# Patient Record
Sex: Female | Born: 2000
Health system: Southern US, Community
[De-identification: ages and names within clinical notes are randomized; demographics above are authoritative.]

## PROBLEM LIST (undated history)

## (undated) DIAGNOSIS — A6000 Herpesviral infection of urogenital system, unspecified: Secondary | ICD-10-CM

## (undated) DIAGNOSIS — J302 Other seasonal allergic rhinitis: Secondary | ICD-10-CM

## (undated) HISTORY — DX: Herpesviral infection of urogenital system, unspecified: A60.00

## (undated) HISTORY — PX: WISDOM TOOTH EXTRACTION: SHX21

---
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
## 2022-01-29 ENCOUNTER — Ambulatory Visit: Payer: Self-pay

## 2022-03-27 ENCOUNTER — Other Ambulatory Visit: Payer: Self-pay

## 2022-03-27 ENCOUNTER — Emergency Department
Admission: EM | Admit: 2022-03-27 | Discharge: 2022-03-27 | Disposition: A | Discharge status: Home / Self Care | Payer: Medicaid Other | Attending: Student in an Organized Health Care Education/Training Program | Admitting: Student in an Organized Health Care Education/Training Program

## 2022-03-27 DIAGNOSIS — O26891 Other specified pregnancy related conditions, first trimester: Secondary | ICD-10-CM | POA: Diagnosis present

## 2022-03-27 DIAGNOSIS — R0602 Shortness of breath: Secondary | ICD-10-CM | POA: Insufficient documentation

## 2022-03-27 DIAGNOSIS — Z3A14 14 weeks gestation of pregnancy: Secondary | ICD-10-CM | POA: Diagnosis not present

## 2022-03-27 DIAGNOSIS — R059 Cough, unspecified: Secondary | ICD-10-CM | POA: Insufficient documentation

## 2022-03-27 DIAGNOSIS — R0781 Pleurodynia: Secondary | ICD-10-CM | POA: Insufficient documentation

## 2022-03-27 DIAGNOSIS — R0789 Other chest pain: Secondary | ICD-10-CM

## 2022-03-27 LAB — BASIC METABOLIC PANEL
Anion gap: 5 (ref 5–15)
BUN: 7 mg/dL (ref 6–20)
CO2: 23 mmol/L (ref 22–32)
Calcium: 9.2 mg/dL (ref 8.9–10.3)
Chloride: 109 mmol/L (ref 98–111)
Creatinine, Ser: 0.62 mg/dL (ref 0.44–1.00)
GFR, Estimated: >60 mL/min (ref >60)
Glucose, Bld: 89 mg/dL (ref 70–99)
Potassium: 3.8 mmol/L (ref 3.5–5.1)
Sodium: 137 mmol/L (ref 135–145)

## 2022-03-27 LAB — CBC WITH DIFFERENTIAL/PLATELET
Abs Immature Granulocytes: 0.03 10*3/uL (ref 0.00–0.07)
Basophils Absolute: 0 10*3/uL (ref 0.0–0.1)
Basophils Relative: 0 %
Eosinophils Absolute: 0.1 10*3/uL (ref 0.0–0.5)
Eosinophils Relative: 1 %
HCT: 36.2 % (ref 36.0–46.0)
Hemoglobin: 11.9 g/dL — ABNORMAL LOW (ref 12.0–15.0)
Immature Granulocytes: 0 %
Lymphocytes Relative: 14 %
Lymphs Abs: 1.5 10*3/uL (ref 0.7–4.0)
MCH: 26.9 pg (ref 26.0–34.0)
MCHC: 32.9 g/dL (ref 30.0–36.0)
MCV: 81.9 fL (ref 80.0–100.0)
Monocytes Absolute: 0.6 10*3/uL (ref 0.1–1.0)
Monocytes Relative: 6 %
Neutro Abs: 8.5 10*3/uL — ABNORMAL HIGH (ref 1.7–7.7)
Neutrophils Relative %: 79 %
Platelets: 314 10*3/uL (ref 150–400)
RBC: 4.42 MIL/uL (ref 3.87–5.11)
RDW: 13.9 % (ref 11.5–15.5)
WBC: 10.8 10*3/uL — ABNORMAL HIGH (ref 4.0–10.5)
nRBC: 0 % (ref 0.0–0.2)

## 2022-03-27 LAB — POC URINE PREG, ED: Preg Test, Ur: POSITIVE — AB

## 2022-03-27 LAB — TROPONIN I (HIGH SENSITIVITY): Troponin I (High Sensitivity): <2 ng/L (ref <18)

## 2022-03-27 MED ORDER — LIDOCAINE 5 % EX PTCH
1.0000 | MEDICATED_PATCH | CUTANEOUS | Status: DC
  Administered 2022-03-27: 1 via TRANSDERMAL
  Filled 2022-03-27: qty 1

## 2022-03-27 NOTE — ED Notes (Signed)
Urine sent to lab at this time.  POC preg done: POS

## 2022-03-27 NOTE — ED Triage Notes (Addendum)
Pt presents to ED with c/o of L sided rib pain and some SOB. Pt denies injury or trauma. Pt denies fever or chills. Both started last night. Pt states cough but not productive at this time. Pt speaking in full sentences with no distress.   Pt is about [redacted] weeks pregnant.

## 2022-03-27 NOTE — ED Provider Notes (Signed)
[facility] Provider Note    Event Date/Time   First MD Initiated Contact with Patient 03/27/22 1136     (approximate)   History   Rib Injury   HPI  Tasha Oneal is a 21 y.o. female currently estimated to be [redacted] weeks pregnant who presents today for evaluation of left-sided rib pain.  Patient also reports that she has had a dry cough that began yesterday.  She reports that her pain is worse with taking deep breaths.  She has not had any lower extremity swelling or calf pain.  There is no personal or family history of PE or DVT.  She denies any injury to her rib.  No fevers or chills.  She reports that she feels slightly short of breath.  Patient Active Problem List   Diagnosis Date Noted   Adjustment disorder with mixed disturbance of emotions and conduct 11/17/2020   Major depressive disorder, recurrent episode, mild with anxious distress (HCC) 11/17/2020          Physical Exam   Triage Vital Signs: ED Triage Vitals  Enc Vitals Group     BP 03/27/22 1120 102/79     Pulse Rate 03/27/22 1120 85     Resp 03/27/22 1120 18     Temp 03/27/22 1120 98.1 F (36.7 C)     Temp Source 03/27/22 1120 Oral     SpO2 03/27/22 1120 98 %     Weight 03/27/22 1133 127 lb 3.3 oz (57.7 kg)     Height 03/27/22 1133 5\' 4"  (1.626 m)     Head Circumference --      Peak Flow --      Pain Score 03/27/22 1120 7     Pain Loc --      Pain Edu? --      Excl. in GC? --     Most recent vital signs: Vitals:   03/27/22 1120  BP: 102/79  Pulse: 85  Resp: 18  Temp: 98.1 F (36.7 C)  SpO2: 98%    Physical Exam Vitals and nursing note reviewed.  Constitutional:      General: Awake and alert. No acute distress.    Appearance: Normal appearance. The patient is normal weight.  HENT:     Head: Normocephalic and atraumatic.     Mouth: Mucous membranes are moist.  Eyes:     General: PERRL. Normal EOMs        Right eye: No discharge.        Left eye: No discharge.      Conjunctiva/sclera: Conjunctivae normal.  Cardiovascular:     Rate and Rhythm: Normal rate and regular rhythm.     Pulses: Normal pulses.  Pulmonary:     Effort: Pulmonary effort is normal. No respiratory distress.     Breath sounds: Normal breath sounds.  Mild chest wall tenderness to left lateral chest. No ecchymosis Abdominal:     Abdomen is soft. There is no abdominal tenderness. No rebound or guarding. No distention. Musculoskeletal:        General: No swelling. Normal range of motion.     Cervical back: Normal range of motion and neck supple.  Skin:    General: Skin is warm and dry.     Capillary Refill: Capillary refill takes less than 2 seconds.     Findings: No rash.  Neurological:     Mental Status: The patient is awake and alert.      ED Results /  Procedures / Treatments   Labs (all labs ordered are listed, but only abnormal results are displayed) Labs Reviewed  CBC WITH DIFFERENTIAL/PLATELET - Abnormal; Notable for the following components:      Result Value   WBC 10.8 (*)    Hemoglobin 11.9 (*)    Neutro Abs 8.5 (*)    All other components within normal limits  POC URINE PREG, ED - Abnormal; Notable for the following components:   Preg Test, Ur POSITIVE (*)    All other components within normal limits  BASIC METABOLIC PANEL  TROPONIN I (HIGH SENSITIVITY)  TROPONIN I (HIGH SENSITIVITY)     EKG     RADIOLOGY     PROCEDURES:  Critical Care performed:   Procedures   MEDICATIONS ORDERED IN ED: Medications  lidocaine (LIDODERM) 5 % 1 patch (1 patch Transdermal Patch Applied 03/27/22 1226)     IMPRESSION / MDM / ASSESSMENT AND PLAN / ED COURSE  I reviewed the triage vital signs and the nursing notes.   Differential diagnosis includes, but is not limited to, pleurisy, costochondritis, URI.  No tachycardia or hypoxia or clinical signs or symptoms of DVT to suggest PE as a source of her symptoms today.  EKG demonstrates normal sinus rhythm,  no ischemic signs or changes, normal intervals.  Patient did wish to proceed with blood work, which was normal. Negative troponin.  No heart strain. Discussed the risks and benefits of obtaining imaging given that she is pregnant, she wishes to forego x-ray at this time which I feel is appropriate.  Lungs are clear to auscultation bilaterally, breath sounds equal bilaterally, doubt spontaneous pneumothorax.  She does report a cough, it is possible that she has costochondritis.  She was treated symptomatically with Lidoderm patch with improvement of her symptoms.  We discussed symptomatic management at home, as well as return precautions.  Patient or stands and agrees with plan.  She was discharged in stable condition.  She requested a work note which was provided.   Patient's presentation is most consistent with acute complicated illness / injury requiring diagnostic workup.   FINAL CLINICAL IMPRESSION(S) / ED DIAGNOSES   Final diagnoses:  Chest wall pain     Rx / DC Orders   ED Discharge Orders     None        Note:  This document was prepared using Dragon voice recognition software and may include unintentional dictation errors.   Jackelyn Hoehn, PA-C 03/27/22 1358    Willy Eddy, MD 03/27/22 1408

## 2022-03-27 NOTE — Discharge Instructions (Addendum)
You may take tylenol 650mg  every 6-8 hours as needed for pain. Please follow up with your outpatient provider. It was a pleasure caring for you.

## 2022-04-20 ENCOUNTER — Ambulatory Visit (INDEPENDENT_AMBULATORY_CARE_PROVIDER_SITE_OTHER): Payer: Medicaid Other

## 2022-04-20 ENCOUNTER — Other Ambulatory Visit: Payer: Self-pay

## 2022-04-20 ENCOUNTER — Other Ambulatory Visit: Payer: Medicaid Other

## 2022-04-20 DIAGNOSIS — Z3402 Encounter for supervision of normal first pregnancy, second trimester: Secondary | ICD-10-CM

## 2022-04-20 DIAGNOSIS — Z3480 Encounter for supervision of other normal pregnancy, unspecified trimester: Secondary | ICD-10-CM | POA: Insufficient documentation

## 2022-04-20 DIAGNOSIS — Z369 Encounter for antenatal screening, unspecified: Secondary | ICD-10-CM

## 2022-04-20 DIAGNOSIS — Z3689 Encounter for other specified antenatal screening: Secondary | ICD-10-CM

## 2022-04-20 DIAGNOSIS — Z3490 Encounter for supervision of normal pregnancy, unspecified, unspecified trimester: Secondary | ICD-10-CM | POA: Insufficient documentation
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
## 2022-04-21 ENCOUNTER — Other Ambulatory Visit: Payer: Self-pay | Admitting: Obstetrics & Gynecology

## 2022-04-21 ENCOUNTER — Telehealth: Payer: Self-pay

## 2022-04-21 LAB — CBC/D/PLT+RPR+RH+ABO+RUBIGG...
Antibody Screen: NEGATIVE
Basophils Absolute: 0 10*3/uL (ref 0.0–0.2)
Basos: 0 %
EOS (ABSOLUTE): 0 10*3/uL (ref 0.0–0.4)
Eos: 0 %
HCV Ab: NONREACTIVE
HIV Screen 4th Generation wRfx: NONREACTIVE
Hematocrit: 36 % (ref 34.0–46.6)
Hemoglobin: 12.1 g/dL (ref 11.1–15.9)
Hepatitis B Surface Ag: NEGATIVE
Immature Grans (Abs): 0 10*3/uL (ref 0.0–0.1)
Immature Granulocytes: 0 %
Lymphocytes Absolute: 1.2 10*3/uL (ref 0.7–3.1)
Lymphs: 15 %
MCH: 27.7 pg (ref 26.6–33.0)
MCHC: 33.6 g/dL (ref 31.5–35.7)
MCV: 82 fL (ref 79–97)
Monocytes Absolute: 0.3 10*3/uL (ref 0.1–0.9)
Monocytes: 4 %
Neutrophils Absolute: 6.5 10*3/uL (ref 1.4–7.0)
Neutrophils: 81 %
Platelets: 299 10*3/uL (ref 150–450)
RBC: 4.37 x10E6/uL (ref 3.77–5.28)
RDW: 12.7 % (ref 11.7–15.4)
RPR Ser Ql: NONREACTIVE
Rh Factor: POSITIVE
Rubella Antibodies, IGG: 4.07 index (ref >0.99)
Varicella zoster IgG: 580 index (ref >165)
WBC: 8.1 10*3/uL (ref 3.4–10.8)

## 2022-04-21 LAB — HCV INTERPRETATION

## 2022-04-21 MED ORDER — VALACYCLOVIR HCL 500 MG PO TABS: 500.0000 mg | ORAL_TABLET | Freq: Every day | ORAL | 5 refills | Status: DC

## 2022-04-21 NOTE — Progress Notes (Signed)
Refill for valtrex given after patient request.

## 2022-04-21 NOTE — Telephone Encounter (Signed)
Pt indicated she needs a refill of valtrex; just got over an outbreak and would like to have more on hand in case she has another one.  Pt is preg.

## 2022-04-23 ENCOUNTER — Ambulatory Visit: Payer: Medicaid Other | Admitting: Physician Assistant

## 2022-04-23 ENCOUNTER — Other Ambulatory Visit: Payer: Self-pay

## 2022-04-23 DIAGNOSIS — Z3689 Encounter for other specified antenatal screening: Secondary | ICD-10-CM

## 2022-04-23 DIAGNOSIS — Z3402 Encounter for supervision of normal first pregnancy, second trimester: Secondary | ICD-10-CM

## 2022-04-23 DIAGNOSIS — Z719 Counseling, unspecified: Secondary | ICD-10-CM

## 2022-04-23 NOTE — Progress Notes (Signed)
Patient was here this afternoon for new OB visit. Patient started her OB care on 04/20/22 at [facility] and is hoping for an U/S. I explained to patient that we cannot do U/S here at [facility] and would have to order it at the hospital just like [facility] would. TC to [facility] and after looking at patient chart with Jean Rosenthal will talk with provider about ordering an U/S and then call the patient. Patient has another appointment at [facility] on 05/05/22. I went back to patient to explain what Huntley Dec said, and asked the patient if she was wanting to start her maternity care here or to continue with [facility]. Patient states she wants to continue with [facility]. She is very excited about her pregnancy and wants to know the sex of her baby. Patient had a materniT21 test at [facility] and patient taken to clerical to help her with signing up for My Chart, so she can view the results. Patient aware to expect call from [facility] regarding U/S day/time/location. Patient counseled to keep her 05/05/22 appointment at [facility], and to call us if she decides she wants to change her prenatal care and come to [facility]. Patient states understanding and states she is grateful for the help.Burt Knack, RN

## 2022-04-25 LAB — MATERNIT 21 PLUS CORE, BLOOD
Fetal Fraction: 7
Result (T21): NEGATIVE
Trisomy 13 (Patau syndrome): NEGATIVE
Trisomy 18 (Edwards syndrome): NEGATIVE
Trisomy 21 (Down syndrome): NEGATIVE

## 2022-04-28 ENCOUNTER — Ambulatory Visit: Payer: Medicaid Other

## 2022-04-30 ENCOUNTER — Ambulatory Visit
Admission: RE | Admit: 2022-04-30 | Discharge: 2022-04-30 | Disposition: A | Discharge status: Home / Self Care | Payer: Medicaid Other | Source: Ambulatory Visit | Attending: Obstetrics & Gynecology | Admitting: Obstetrics & Gynecology

## 2022-04-30 DIAGNOSIS — Z3689 Encounter for other specified antenatal screening: Secondary | ICD-10-CM | POA: Insufficient documentation

## 2022-04-30 DIAGNOSIS — Z3402 Encounter for supervision of normal first pregnancy, second trimester: Secondary | ICD-10-CM | POA: Insufficient documentation

## 2022-04-30 DIAGNOSIS — Z3687 Encounter for antenatal screening for uncertain dates: Secondary | ICD-10-CM | POA: Diagnosis present

## 2022-04-30 DIAGNOSIS — Z3A14 14 weeks gestation of pregnancy: Secondary | ICD-10-CM | POA: Insufficient documentation

## 2022-05-04 ENCOUNTER — Encounter: Payer: Self-pay | Admitting: Obstetrics & Gynecology

## 2022-05-04 DIAGNOSIS — O4402 Placenta previa specified as without hemorrhage, second trimester: Secondary | ICD-10-CM | POA: Insufficient documentation

## 2022-05-05 ENCOUNTER — Other Ambulatory Visit (HOSPITAL_COMMUNITY)
Admission: RE | Admit: 2022-05-05 | Discharge: 2022-05-05 | Disposition: A | Discharge status: Home / Self Care | Payer: BC Managed Care – PPO | Source: Ambulatory Visit | Attending: Obstetrics & Gynecology | Admitting: Obstetrics & Gynecology

## 2022-05-05 ENCOUNTER — Ambulatory Visit (INDEPENDENT_AMBULATORY_CARE_PROVIDER_SITE_OTHER): Payer: Medicaid Other | Admitting: Obstetrics & Gynecology

## 2022-05-05 VITALS — BP 112/71 | HR 80 | Wt 133.0 lb

## 2022-05-05 DIAGNOSIS — Z3A15 15 weeks gestation of pregnancy: Secondary | ICD-10-CM

## 2022-05-05 DIAGNOSIS — Z3402 Encounter for supervision of normal first pregnancy, second trimester: Secondary | ICD-10-CM

## 2022-05-05 DIAGNOSIS — N898 Other specified noninflammatory disorders of vagina: Secondary | ICD-10-CM | POA: Insufficient documentation

## 2022-05-05 MED ORDER — VALACYCLOVIR HCL 500 MG PO TABS: 500.0000 mg | ORAL_TABLET | Freq: Every day | ORAL | 5 refills | Status: DC

## 2022-05-05 NOTE — Progress Notes (Signed)
  Subjective:    Tasha Oneal is a single G1P0 at [redacted] weeks EGA being seen today for her first obstetrical visit.  This is not a planned pregnancy. She is at [redacted]w[redacted]d gestation. Her obstetrical history is significant for  hsv . Relationship with FOB:  not together at the present time . Patient does intend to breast feed. Pregnancy history fully reviewed. She had an ultrasound last week that dates her at 14.4 weeks at the time  Patient reports no complaints.  Review of Systems:   Review of Systems  Objective:     BP 112/71   Pulse 80   Wt 133 lb (60.3 kg)   LMP 12/18/2021 (Exact Date)   BMI 22.83 kg/m  Physical Exam  Well nourished, well hydrated  female, no apparent distress She is ambulating and conversing normally. General:  alert   Breasts:  inspection negative, no nipple discharge or bleeding, no masses or nodularity palpable  Lungs: clear to auscultation bilaterally  Heart:  regular rate and rhythm, S1, S2 normal, no click, rub or gallop. There is a 2/6 SEM  Abdomen: soft, non-tender; bowel sounds normal; no masses,  no organomegaly FHR 145   Vulva:  normal  Vagina: normal  Cervix:  No lesions, normal discharge  Corpus: 15 week size  Adnexa:  not enlarged or painful  Rectal Exam: Not performed.         Assessment:    Pregnancy: G1P0 Patient Active Problem List   Diagnosis Date Noted   Placenta previa antepartum in second trimester 05/04/2022   Supervision of other normal pregnancy, antepartum 04/20/2022   Adjustment disorder with mixed disturbance of emotions and conduct 11/17/2020   Major depressive disorder, recurrent episode, mild with anxious distress (HCC) 11/17/2020       Plan:     Initial labs drawn. Prenatal vitamins. Problem list reviewed and updated. Role of ultrasound in pregnancy discussed; fetal survey: ordered. Amniocentesis discussed: not indicated. Follow up in 4 weeks. + HSV- on suppressive treatment. Refills given as requested  Allie Bossier 05/05/2022

## 2022-05-05 NOTE — Addendum Note (Signed)
Addended by: Loney Laurence on: 05/05/2022 08:49 AM   Modules accepted: Orders

## 2022-05-06 LAB — CERVICOVAGINAL ANCILLARY ONLY
Bacterial Vaginitis (gardnerella): NEGATIVE
Candida Glabrata: NEGATIVE
Candida Vaginitis: POSITIVE — AB
Chlamydia: NEGATIVE
Comment: NEGATIVE
Comment: NEGATIVE
Comment: NEGATIVE
Comment: NEGATIVE
Comment: NEGATIVE
Comment: NORMAL
Neisseria Gonorrhea: NEGATIVE
Trichomonas: NEGATIVE

## 2022-05-06 LAB — URINALYSIS, ROUTINE W REFLEX MICROSCOPIC
Bilirubin, UA: NEGATIVE
Glucose, UA: NEGATIVE
Ketones, UA: NEGATIVE
Nitrite, UA: NEGATIVE
RBC, UA: NEGATIVE
Specific Gravity, UA: >=1.03 — AB (ref 1.005–1.030)
Urobilinogen, Ur: 0.2 mg/dL (ref 0.2–1.0)
pH, UA: 6 (ref 5.0–7.5)

## 2022-05-06 LAB — MICROSCOPIC EXAMINATION
Casts: NONE SEEN /lpf
Epithelial Cells (non renal): >10 /hpf — AB (ref 0–10)
RBC, Urine: NONE SEEN /hpf (ref 0–2)

## 2022-05-07 LAB — CULTURE, OB URINE

## 2022-05-07 LAB — URINE CULTURE, OB REFLEX

## 2022-05-08 LAB — MONITOR DRUG PROFILE 14(MW)

## 2022-05-08 LAB — NICOTINE SCREEN, URINE

## 2022-05-18 NOTE — L&D Delivery Note (Signed)
Delivery Note At  1314, a viable female was delivered vaginally over an intact perineum with one small left labial laceration. The head presented direct OA, with restitution to ROT. The anterioir shoulder delivered with gentle downward guidance, followed by upward guidance to deliver the posterior shoulder. Spontaneous respirations were noted from the baby. APGAR:8 9, ; weight  .pending   Placenta status: delivered intact at 1324 ,  .  Cord: 3 vessel  with the following complications:  none.    Anesthesia:  epidural Episiotomy:  none Lacerations:  right labial Suture Repair: 3.0 vicryl rapide Est. Blood Loss (mL):  200  Mom to postpartum.  Baby to Couplet care / Skin to Skin.  Mirna Mires 10/28/2022, 1:48 PM

## 2022-06-01 ENCOUNTER — Other Ambulatory Visit: Payer: Medicaid Other

## 2022-06-01 ENCOUNTER — Telehealth: Payer: Self-pay | Admitting: Advanced Practice Midwife

## 2022-06-01 NOTE — Telephone Encounter (Signed)
I contacted patient via phone. I left message for patient to call back about rescheduling her anatomy ultrasound due to no ultrasound tech in office. I left number to centralized scheduling [phone] option

## 2022-06-02 ENCOUNTER — Ambulatory Visit (INDEPENDENT_AMBULATORY_CARE_PROVIDER_SITE_OTHER): Payer: Medicaid Other | Admitting: Advanced Practice Midwife

## 2022-06-02 ENCOUNTER — Encounter: Payer: Self-pay | Admitting: Advanced Practice Midwife

## 2022-06-02 VITALS — BP 120/80 | Wt 135.0 lb

## 2022-06-02 DIAGNOSIS — Z3A19 19 weeks gestation of pregnancy: Secondary | ICD-10-CM

## 2022-06-02 DIAGNOSIS — Z3402 Encounter for supervision of normal first pregnancy, second trimester: Secondary | ICD-10-CM

## 2022-06-02 LAB — POCT URINALYSIS DIPSTICK OB
Bilirubin, UA: NEGATIVE
Blood, UA: NEGATIVE
Glucose, UA: NEGATIVE
Ketones, UA: NEGATIVE
Leukocytes, UA: NEGATIVE
Nitrite, UA: NEGATIVE
POC,PROTEIN,UA: NEGATIVE
Spec Grav, UA: 1.01 (ref 1.010–1.025)
Urobilinogen, UA: 1 E.U./dL
pH, UA: 6 (ref 5.0–8.0)

## 2022-06-02 NOTE — Progress Notes (Signed)
Routine Prenatal Care Visit  Subjective  Tasha Oneal is a 22 y.o. G1P0 at [redacted]w[redacted]d being seen today for ongoing prenatal care.  She is currently monitored for the following issues for this low-risk pregnancy and has Adjustment disorder with mixed disturbance of emotions and conduct; Major depressive disorder, recurrent episode, mild with anxious distress ([place]); Supervision of normal pregnancy; and Placenta previa antepartum in second trimester on their problem list.  ----------------------------------------------------------------------------------- Patient reports no complaints.   Contractions: Not present. Vag. Bleeding: None.  Movement: Absent. Leaking Fluid denies.  ----------------------------------------------------------------------------------- The following portions of the patient's history were reviewed and updated as appropriate: allergies, current medications, past family history, past medical history, past social history, past surgical history and problem list. Problem list updated.  Objective  Blood pressure 120/80, weight 135 lb (61.2 kg), last menstrual period 12/18/2021. Pregravid weight 127 lb (57.6 kg) Total Weight Gain 8 lb (3.629 kg) Urinalysis: Urine Protein    Urine Glucose    Fetal Status: Fetal Heart Rate (bpm): 145 Fundal Height: 20 cm Movement: Absent     General:  Alert, oriented and cooperative. Patient is in no acute distress.  Skin: Skin is warm and dry. No rash noted.   Cardiovascular: Normal heart rate noted  Respiratory: Normal respiratory effort, no problems with respiration noted  Abdomen: Soft, gravid, appropriate for gestational age. Pain/Pressure: Absent     Pelvic:  Cervical exam deferred        Extremities: Normal range of motion.  Edema: None  Mental Status: Normal mood and affect. Normal behavior. Normal judgment and thought content.   Assessment   22 y.o. G1P0 at [redacted]w[redacted]d by  10/25/2022, by Ultrasound presenting for routine prenatal visit  Plan    first Problems (from 04/20/22 to present)     Problem Noted Resolved   Supervision of normal pregnancy 04/20/2022 by Cleophas Dunker, CMA No   Overview Addendum 04/20/2022  4:53 PM by Cleophas Dunker, [place] Staff Provider  Office Location  [facility] Dating  Not found.  Language  English Anatomy US    Flu Vaccine  Needs to get at HD Genetic Screen  NIPS:   TDaP vaccine   offer Hgb A1C or  GTT Early : Third trimester :   Covid declines   LAB RESULTS   Rhogam     Blood Type     Feeding Plan breast Antibody    Contraception nexplanon Rubella    Circumcision yes RPR     Pediatrician  [facility]     Support Person Mom HIV    Prenatal Classes yes Varicella     GBS  (For PCN allergy, check sensitivities)   BTL Consent  Hep C     VBAC Consent  Pap No results found for: "DIAGPAP"    Hgb Electro      CF      SMA                   Preterm labor symptoms and general obstetric precautions including but not limited to vaginal bleeding, contractions, leaking of fluid and fetal movement were reviewed in detail with the patient. Please refer to After Visit Summary for other counseling recommendations.   Return in about 6 days (around 06/08/2022) for after anatomy scan to review results (can be telephone call) and ROB in 4 weeks.  Rod Can, CNM 06/02/2022 1:39 PM

## 2022-06-02 NOTE — Patient Instructions (Signed)
Placenta Previa  The placenta is the organ formed during pregnancy. It carries oxygen and nutrients to the unborn baby (fetus). The placenta is the baby's life support system. Placenta previa happens when the placenta implants in the lower part of the uterus. The placenta either partially or completely covers the opening to the cervix. This can cause severe bleeding during late pregnancy or delivery. If placenta previa is diagnosed in the first half of pregnancy, the placenta may move into a normal position as the pregnancy progresses. It is important to keep all prenatal visits with your health care provider so that you can be closely monitored. What are the causes? The cause of this condition is not known. What increases the risk? The following factors may make you more likely to develop this condition: Having scars on the lining of the uterus. Having had previous pregnancies. Having had surgeries involving the uterus, such as a cesarean delivery. Having a history of placenta previa. Having smoked or used cocaine during pregnancy. Being 20 years of age or older during pregnancy. What are the signs or symptoms? The main symptom of this condition is sudden, painless, bright red vaginal bleeding during the second half of pregnancy. The amount of bleeding can be very light at first, and it usually stops on its own. Heavier bleeding episodes may also happen. Some women with placenta previa may have no bleeding at all. How is this diagnosed? This condition may be diagnosed during a routine ultrasound or during a checkup after vaginal bleeding is noticed. In general: If you are diagnosed with placenta previa, digital exams, which use the fingers, will be avoided. Your health care provider will still perform a speculum exam. If you did not have an ultrasound during your pregnancy, placenta previa may not be diagnosed until bleeding occurs during labor. How is this treated? Treatment for this condition  depends on: How much you are bleeding, or whether the bleeding has stopped. How far along you are in your pregnancy. The condition of your baby. How much of the placenta is covering the cervix. Treatment may include: Decreased activity. Bed rest at home or in the hospital. Pelvic rest. Nothing is placed inside the vagina during pelvic rest. This means not having sex, not using tampons, and not using douches. A blood transfusion to replace blood that you have lost (maternal blood loss). A cesarean delivery. This may be performed if: The bleeding is heavy and cannot be controlled. The placenta completely covers the cervix. Medicines to stop premature labor or to help the baby's lungs to mature. This treatment may be used if you need to deliver your baby before your pregnancy is full-term. Follow these instructions at home: Get plenty of rest and reduce activity as told by your health care provider. If told by your health care provider, stay on bed rest for the length of time that is recommended. Do not have sex, use tampons, douche, or place anything inside of your vagina if your health care provider recommends pelvic rest. Take over-the-counter and prescription medicines only as told by your health care provider. Keep all follow-up visits. This is important. Get help right away if: You have vaginal bleeding, even if in small amounts and even if you have no pain. You have cramping or regular contractions. You have pain in your abdomen or your lower back. You have a feeling of increased pressure in your pelvis. You have increased watery or bloody mucus from your vagina. You have not felt your baby moving  regularly. Summary Placenta previa is a condition in which the placenta implants in the lower part of the uterus in a pregnant woman. The cause of this condition is not known. The most common symptom of placenta previa is painless, bright red bleeding during pregnancy. It is important to  keep all prenatal visits with your health care provider so you can be closely monitored. Get help right away if you have placenta previa and you are experiencing bleeding during pregnancy. This information is not intended to replace advice given to you by your health care provider. Make sure you discuss any questions you have with your health care provider. Document Revised: 12/25/2019 Document Reviewed: 12/25/2019 Elsevier Patient Education  [place].

## 2022-06-08 ENCOUNTER — Ambulatory Visit
Admission: RE | Admit: 2022-06-08 | Discharge: 2022-06-08 | Disposition: A | Discharge status: Home / Self Care | Payer: Medicaid Other | Source: Ambulatory Visit | Attending: Obstetrics & Gynecology | Admitting: Obstetrics & Gynecology

## 2022-06-08 DIAGNOSIS — Z3A15 15 weeks gestation of pregnancy: Secondary | ICD-10-CM | POA: Insufficient documentation

## 2022-06-08 DIAGNOSIS — Z3689 Encounter for other specified antenatal screening: Secondary | ICD-10-CM | POA: Diagnosis not present

## 2022-06-08 DIAGNOSIS — Z3402 Encounter for supervision of normal first pregnancy, second trimester: Secondary | ICD-10-CM | POA: Diagnosis present

## 2022-06-09 ENCOUNTER — Encounter: Payer: Medicaid Other | Admitting: Obstetrics and Gynecology

## 2022-07-07 ENCOUNTER — Ambulatory Visit (INDEPENDENT_AMBULATORY_CARE_PROVIDER_SITE_OTHER): Payer: Medicaid Other | Admitting: Advanced Practice Midwife

## 2022-07-07 ENCOUNTER — Encounter: Payer: Self-pay | Admitting: Advanced Practice Midwife

## 2022-07-07 VITALS — BP 104/65 | HR 80 | Wt 148.0 lb

## 2022-07-07 DIAGNOSIS — Z130 Encounter for screening for diseases of the blood and blood-forming organs and certain disorders involving the immune mechanism: Secondary | ICD-10-CM

## 2022-07-07 DIAGNOSIS — Z3A24 24 weeks gestation of pregnancy: Secondary | ICD-10-CM

## 2022-07-07 DIAGNOSIS — Z131 Encounter for screening for diabetes mellitus: Secondary | ICD-10-CM

## 2022-07-07 DIAGNOSIS — Z113 Encounter for screening for infections with a predominantly sexual mode of transmission: Secondary | ICD-10-CM

## 2022-07-07 DIAGNOSIS — Z3402 Encounter for supervision of normal first pregnancy, second trimester: Secondary | ICD-10-CM

## 2022-07-07 DIAGNOSIS — Z369 Encounter for antenatal screening, unspecified: Secondary | ICD-10-CM

## 2022-07-07 LAB — POCT URINALYSIS DIPSTICK OB
Bilirubin, UA: NEGATIVE
Blood, UA: NEGATIVE
Glucose, UA: NEGATIVE
Ketones, UA: NEGATIVE
Leukocytes, UA: NEGATIVE
Nitrite, UA: NEGATIVE
POC,PROTEIN,UA: NEGATIVE
Spec Grav, UA: 1.02 (ref 1.010–1.025)
Urobilinogen, UA: 0.2 E.U./dL
pH, UA: 8 (ref 5.0–8.0)

## 2022-07-07 NOTE — Progress Notes (Signed)
Routine Prenatal Care Visit  Subjective  Tasha Oneal is a 22 y.o. G1P0 at 56w2dbeing seen today for ongoing prenatal care.  She is currently monitored for the following issues for this low-risk pregnancy and has Adjustment disorder with mixed disturbance of emotions and conduct; Major depressive disorder, recurrent episode, mild with anxious distress ([place]; and Supervision of normal pregnancy on their problem list.  ----------------------------------------------------------------------------------- Patient reports no complaints.  Her pregnancy and childbirth education questions are answered. She is hoping to deliver without epidural. She is encouraged to take class and to look into doula support. Contractions: Not present. Vag. Bleeding: None.  Movement: Present. Leaking Fluid denies.  ----------------------------------------------------------------------------------- The following portions of the patient's history were reviewed and updated as appropriate: allergies, current medications, past family history, past medical history, past social history, past surgical history and problem list. Problem list updated.  Objective  Blood pressure 104/65, pulse 80, weight 148 lb (67.1 kg), last menstrual period 12/18/2021. Pregravid weight 127 lb (57.6 kg) Total Weight Gain 21 lb (9.526 kg) Urinalysis: Urine Protein    Urine Glucose    Fetal Status: Fetal Heart Rate (bpm): 133 Fundal Height: 24 cm Movement: Present     General:  Alert, oriented and cooperative. Patient is in no acute distress.  Skin: Skin is warm and dry. No rash noted.   Cardiovascular: Normal heart rate noted  Respiratory: Normal respiratory effort, no problems with respiration noted  Abdomen: Soft, gravid, appropriate for gestational age. Pain/Pressure: Absent     Pelvic:  Cervical exam deferred        Extremities: Normal range of motion.  Edema: None  Mental Status: Normal mood and affect. Normal behavior. Normal judgment and  thought content.   Assessment   22y.o. G1P0 at 227w2dy  10/25/2022, by Ultrasound presenting for routine prenatal visit  Plan   first Problems (from 04/20/22 to present)     Problem Noted Resolved   Supervision of normal pregnancy 04/20/2022 by JoCleophas DunkerCMA No   Overview Addendum 04/20/2022  4:53 PM by JoCleophas Dunk[place]CM[place] Provider  Office Location  [facility] Dating  Not found.  Language  English Anatomy [place]  Flu Vaccine  Needs to get at HD Genetic Screen  NIPS:   TDaP vaccine   offer Hgb A1C or  GTT Early : Third trimester :   Covid declines   LAB RESULTS   Rhogam     Blood Type     Feeding Plan breast Antibody    Contraception nexplanon Rubella    Circumcision yes RPR     Pediatrician  [facility]     Support Person Mom HIV    Prenatal Classes yes Varicella     GBS  (For PCN allergy, check sensitivities)   BTL Consent  Hep C     VBAC Consent  Pap No results found for: "DIAGPAP"    Hgb Electro      CF      SMA                   Preterm labor symptoms and general obstetric precautions including but not limited to vaginal bleeding, contractions, leaking of fluid and fetal movement were reviewed in detail with the patient. Please refer to After Visit Summary for other counseling recommendations.   Return in about 4 weeks (around 08/04/2022) for 28w labs and rob.  JaRod CanCNM 07/07/2022 1:44 PM

## 2022-08-03 ENCOUNTER — Other Ambulatory Visit: Payer: Medicaid Other

## 2022-08-03 ENCOUNTER — Encounter: Payer: Medicaid Other | Admitting: Advanced Practice Midwife

## 2022-08-03 ENCOUNTER — Telehealth: Payer: Self-pay | Admitting: Advanced Practice Midwife

## 2022-08-03 NOTE — Telephone Encounter (Signed)
Reached out to pt to reschedule a lab appt and a ROB appt with JEG  that was scheduled on 08/03/2022 (lab appt-8:40 and ROB appt.-9:55).  Left message for pt to call back to reschedule.

## 2022-08-04 ENCOUNTER — Encounter: Payer: Self-pay | Admitting: Advanced Practice Midwife

## 2022-08-04 NOTE — Telephone Encounter (Signed)
Reached out to pt (2x) to reschedule a lab appt and a ROB appt withJEG that was scheduled on 08/03/2022 (lab appt-8:40 and ROB apt.-9:55).  Could not leave a message bc mailbox was full.  Will send a MyChart letter.

## 2022-08-20 ENCOUNTER — Other Ambulatory Visit: Payer: Medicaid Other

## 2022-08-20 ENCOUNTER — Ambulatory Visit (INDEPENDENT_AMBULATORY_CARE_PROVIDER_SITE_OTHER): Payer: Medicaid Other | Admitting: Obstetrics

## 2022-08-20 VITALS — BP 112/69 | HR 93 | Wt 158.4 lb

## 2022-08-20 DIAGNOSIS — Z3403 Encounter for supervision of normal first pregnancy, third trimester: Secondary | ICD-10-CM

## 2022-08-20 DIAGNOSIS — Z3402 Encounter for supervision of normal first pregnancy, second trimester: Secondary | ICD-10-CM

## 2022-08-20 DIAGNOSIS — Z369 Encounter for antenatal screening, unspecified: Secondary | ICD-10-CM

## 2022-08-20 DIAGNOSIS — Z113 Encounter for screening for infections with a predominantly sexual mode of transmission: Secondary | ICD-10-CM

## 2022-08-20 DIAGNOSIS — Z3A30 30 weeks gestation of pregnancy: Secondary | ICD-10-CM

## 2022-08-20 DIAGNOSIS — Z131 Encounter for screening for diabetes mellitus: Secondary | ICD-10-CM

## 2022-08-20 DIAGNOSIS — Z130 Encounter for screening for diseases of the blood and blood-forming organs and certain disorders involving the immune mechanism: Secondary | ICD-10-CM

## 2022-08-20 LAB — POCT URINALYSIS DIPSTICK OB
Bilirubin, UA: NEGATIVE
Blood, UA: NEGATIVE
Glucose, UA: NEGATIVE
Ketones, UA: NEGATIVE
Leukocytes, UA: NEGATIVE
Nitrite, UA: NEGATIVE
POC,PROTEIN,UA: NEGATIVE
Spec Grav, UA: 1.015 (ref 1.010–1.025)
Urobilinogen, UA: 0.2 E.U./dL
pH, UA: 5 (ref 5.0–8.0)

## 2022-08-20 NOTE — Progress Notes (Signed)
Routine Prenatal Care Visit  Subjective  Tasha Oneal is a 22 y.o. G1P0 at [redacted]w[redacted]d being seen today for ongoing prenatal care.  She is currently monitored for the following issues for this low-risk pregnancy and has Adjustment disorder with mixed disturbance of emotions and conduct; Major depressive disorder, recurrent episode, mild with anxious distress; and Supervision of normal pregnancy on their problem list.  ----------------------------------------------------------------------------------- Patient reports no contractions, no cramping, and no leaking.   Contractions: Not present. Vag. Bleeding: None.  Movement: Present. Leaking Fluid denies.  ----------------------------------------------------------------------------------- The following portions of the patient's history were reviewed and updated as appropriate: allergies, current medications, past family history, past medical history, past social history, past surgical history and problem list. Problem list updated.  Objective  Blood pressure 112/69, pulse 93, weight 158 lb 6.4 oz (71.8 kg), last menstrual period 12/18/2021. Pregravid weight 127 lb (57.6 kg) Total Weight Gain 31 lb 6.4 oz (14.2 kg) Urinalysis: Urine Protein Negative  Urine Glucose Negative  Fetal Status:     Movement: Present     General:  Alert, oriented and cooperative. Patient is in no acute distress.  Skin: Skin is warm and dry. No rash noted.   Cardiovascular: Normal heart rate noted  Respiratory: Normal respiratory effort, no problems with respiration noted  Abdomen: Soft, gravid, appropriate for gestational age. Pain/Pressure: Present     Pelvic:  Cervical exam deferred        Extremities: Normal range of motion.  Edema: None  Mental Status: Normal mood and affect. Normal behavior. Normal judgment and thought content.   Assessment   22 y.o. G1P0 at [redacted]w[redacted]d by  10/25/2022, by Ultrasound presenting for routine prenatal visit 28 week labs today  Plan   first  Problems (from 04/20/22 to present)     Problem Noted Resolved   Supervision of normal pregnancy 04/20/2022 by Cleophas Dunker, [place] No   Overview Addendum 08/20/2022  9:39 AM by Imagene Riches, CNM     Clinical Staff Provider  Office Location  [facility] Dating  Not found.  Language  English Anatomy US    Flu Vaccine  Needs to get at HD Genetic Screen  NIPS:   TDaP vaccine   offer Hgb A1C or  GTT Early : Third trimester :   Covid declines   LAB RESULTS   Rhogam  O/Positive/-- (12/04 1627)  Blood Type O/Positive/-- (12/04 1627)   Feeding Plan breast Antibody Negative (12/04 1627)  Contraception nexplanon Rubella 4.07 (12/04 1627)  Circumcision yes RPR Non Reactive (12/04 1627)   Pediatrician  [facility] HBsAg Negative (12/04 1627)   Support Person Mom HIV Non Reactive (12/04 1627)  Prenatal Classes yes Varicella     GBS  (For PCN allergy, check sensitivities)   BTL Consent  Hep C Non Reactive (12/04 1627)   VBAC Consent  Pap No results found for: "DIAGPAP"    Hgb Electro      CF      SMA                   Preterm labor symptoms and general obstetric precautions including but not limited to vaginal bleeding, contractions, leaking of fluid and fetal movement were reviewed in detail with the patient. Please refer to After Visit Summary for other counseling recommendations.  Asking many appropriate questions about labor and birth today- all questions answered by Jarrett Soho black SNM.  Return in about 2 weeks (around 09/03/2022) for return OB.  Imagene Riches, CNM  08/20/2022 9:41 AM

## 2022-08-21 LAB — 28 WEEK RH+PANEL
Basophils Absolute: 0 10*3/uL (ref 0.0–0.2)
Basos: 0 %
EOS (ABSOLUTE): 0.1 10*3/uL (ref 0.0–0.4)
Eos: 1 %
Gestational Diabetes Screen: 111 mg/dL (ref 70–139)
HIV Screen 4th Generation wRfx: NONREACTIVE
Hematocrit: 30.6 % — ABNORMAL LOW (ref 34.0–46.6)
Hemoglobin: 9.7 g/dL — ABNORMAL LOW (ref 11.1–15.9)
Immature Grans (Abs): 0 10*3/uL (ref 0.0–0.1)
Immature Granulocytes: 0 %
Lymphocytes Absolute: 1.8 10*3/uL (ref 0.7–3.1)
Lymphs: 20 %
MCH: 26.5 pg — ABNORMAL LOW (ref 26.6–33.0)
MCHC: 31.7 g/dL (ref 31.5–35.7)
MCV: 84 fL (ref 79–97)
Monocytes Absolute: 0.4 10*3/uL (ref 0.1–0.9)
Monocytes: 5 %
Neutrophils Absolute: 6.7 10*3/uL (ref 1.4–7.0)
Neutrophils: 74 %
Platelets: 328 10*3/uL (ref 150–450)
RBC: 3.66 x10E6/uL — ABNORMAL LOW (ref 3.77–5.28)
RDW: 14.5 % (ref 11.7–15.4)
RPR Ser Ql: NONREACTIVE
WBC: 9.1 10*3/uL (ref 3.4–10.8)

## 2022-08-31 ENCOUNTER — Ambulatory Visit (INDEPENDENT_AMBULATORY_CARE_PROVIDER_SITE_OTHER): Payer: Medicaid Other | Admitting: Obstetrics and Gynecology

## 2022-08-31 ENCOUNTER — Encounter: Payer: Self-pay | Admitting: Obstetrics and Gynecology

## 2022-08-31 VITALS — BP 108/70 | HR 84 | Wt 156.4 lb

## 2022-08-31 DIAGNOSIS — Z3A32 32 weeks gestation of pregnancy: Secondary | ICD-10-CM

## 2022-08-31 DIAGNOSIS — Z3403 Encounter for supervision of normal first pregnancy, third trimester: Secondary | ICD-10-CM

## 2022-08-31 LAB — POCT URINALYSIS DIPSTICK OB
Bilirubin, UA: NEGATIVE
Blood, UA: NEGATIVE
Glucose, UA: NEGATIVE
Ketones, UA: NEGATIVE
Leukocytes, UA: NEGATIVE
Nitrite, UA: NEGATIVE
POC,PROTEIN,UA: NEGATIVE
Spec Grav, UA: 1.025 (ref 1.010–1.025)
Urobilinogen, UA: 0.2 E.U./dL
pH, UA: 6 (ref 5.0–8.0)

## 2022-08-31 NOTE — Progress Notes (Signed)
ROB: Doing well-excited about being pregnant and about her future birth.  Discussed use of extra iron and recommend twice a day with meals.  Hemoglobin recheck at next visit.

## 2022-08-31 NOTE — Progress Notes (Signed)
Tasha Oneal. Patient states daily fetal movement. Complaints of soreness and tingling feet at night. She states she has not started an iron supplement yet but is planning to this week along with a new prenatal.

## 2022-09-15 ENCOUNTER — Ambulatory Visit (INDEPENDENT_AMBULATORY_CARE_PROVIDER_SITE_OTHER): Payer: BC Managed Care – PPO | Admitting: Advanced Practice Midwife

## 2022-09-15 ENCOUNTER — Ambulatory Visit: Payer: Medicaid Other

## 2022-09-15 ENCOUNTER — Encounter: Payer: Self-pay | Admitting: Advanced Practice Midwife

## 2022-09-15 VITALS — BP 104/74 | HR 88 | Wt 162.6 lb

## 2022-09-15 VITALS — BP 104/74 | HR 88 | Ht 64.0 in | Wt 162.6 lb

## 2022-09-15 DIAGNOSIS — D649 Anemia, unspecified: Secondary | ICD-10-CM | POA: Diagnosis not present

## 2022-09-15 DIAGNOSIS — O99343 Other mental disorders complicating pregnancy, third trimester: Secondary | ICD-10-CM

## 2022-09-15 DIAGNOSIS — O99891 Other specified diseases and conditions complicating pregnancy: Secondary | ICD-10-CM

## 2022-09-15 DIAGNOSIS — Z3A34 34 weeks gestation of pregnancy: Secondary | ICD-10-CM

## 2022-09-15 DIAGNOSIS — O368130 Decreased fetal movements, third trimester, not applicable or unspecified: Secondary | ICD-10-CM

## 2022-09-15 DIAGNOSIS — Z3403 Encounter for supervision of normal first pregnancy, third trimester: Secondary | ICD-10-CM

## 2022-09-15 DIAGNOSIS — R3915 Urgency of urination: Secondary | ICD-10-CM

## 2022-09-15 DIAGNOSIS — O99013 Anemia complicating pregnancy, third trimester: Secondary | ICD-10-CM

## 2022-09-15 DIAGNOSIS — F419 Anxiety disorder, unspecified: Secondary | ICD-10-CM

## 2022-09-15 LAB — POCT URINALYSIS DIPSTICK OB
Bilirubin, UA: NEGATIVE
Blood, UA: NEGATIVE
Glucose, UA: NEGATIVE
Ketones, UA: NEGATIVE
Leukocytes, UA: NEGATIVE
Nitrite, UA: NEGATIVE
POC,PROTEIN,UA: NEGATIVE
Spec Grav, UA: 1.01 (ref 1.010–1.025)
Urobilinogen, UA: 0.2 E.U./dL
pH, UA: 6.5 (ref 5.0–8.0)

## 2022-09-15 MED ORDER — SODIUM CHLORIDE 0.9 % IV SOLN: 250.0000 mg | INTRAVENOUS | Status: DC

## 2022-09-15 MED ORDER — SERTRALINE HCL 50 MG PO TABS: 50.0000 mg | ORAL_TABLET | Freq: Every day | ORAL | 3 refills | Status: DC

## 2022-09-15 NOTE — Progress Notes (Signed)
    NURSE VISIT NOTE  Subjective:    Patient ID: Tasha Oneal, female    DOB: 26-Aug-2000, 22 y.o.   MRN: [id number]  HPI  Patient is a 22 y.o. G1P0 female who presents for fetal monitoring per order from Tresea Mall, CNM.   Objective:    BP 104/74   Pulse 88   Ht 5\' 4"  (1.626 m)   Wt 162 lb 9.6 oz (73.8 kg)   LMP 12/18/2021 (Exact Date)   BMI 27.91 kg/m  Estimated Date of Delivery: 10/25/22  [redacted]w[redacted]d  Fetus A Non-Stress Test Interpretation for 09/15/22  Indication: Decreased Fetal Movement  Fetal Heart Rate A Mode: External Baseline Rate (A): 130 bpm Variability: Moderate Accelerations: 15 x 15 Decelerations: None Multiple birth?: No  Uterine Activity Mode: Toco Contraction Frequency (min): None  Interpretation (Fetal Testing) Nonstress Test Interpretation: Reactive Overall Impression: Reassuring for gestational age   Assessment:   1. Decreased fetal movements in third trimester, single or unspecified fetus   2. [redacted] weeks gestation of pregnancy      Plan:   Results reviewed and discussed with patient by  Tresea Mall, CNM.     Rocco Serene, LPN

## 2022-09-15 NOTE — Addendum Note (Signed)
Addended by: Tresea Mall on: 09/15/2022 03:23 PM   Modules accepted: Orders

## 2022-09-15 NOTE — Progress Notes (Signed)
Routine Prenatal Care Visit  Subjective  Tasha Oneal is a 22 y.o. G1P0 at [redacted]w[redacted]d being seen today for ongoing prenatal care.  She is currently monitored for the following issues for this low-risk pregnancy and has Adjustment disorder with mixed disturbance of emotions and conduct; Major depressive disorder, recurrent episode, mild with anxious distress (H[facility]); Supervision of normal pregnancy; Decreased fetal movements in third trimester; and [redacted] weeks gestation of pregnancy on their problem list.  ----------------------------------------------------------------------------------- Patient reports  concerns regarding mood especially what she may experience postpartum. She has been on antidepressant in the past and she would like to start medication now. She plans to breastfeed and we discussed recommendation to take zoloft .  She mentions urinary urgency and took a home test that indicated uti. Will run a urine culture today. Hgb in office today is 8.8- iron infusion x3 ordered.  Contractions: Not present. Vag. Bleeding: None.  Movement: (!) Decreased. Leaking Fluid denies.  ----------------------------------------------------------------------------------- The following portions of the patient's history were reviewed and updated as appropriate: allergies, current medications, past family history, past medical history, past social history, past surgical history and problem list. Problem list updated.  Objective  Blood pressure 104/74, pulse 88, weight 162 lb 9.6 oz (73.8 kg), last menstrual period 12/18/2021. Pregravid weight 127 lb (57.6 kg) Total Weight Gain 35 lb 9.6 oz (16.1 kg) Urinalysis: Urine Protein Negative  Urine Glucose Negative  Fetal Status: Fetal Heart Rate (bpm): 130 Fundal Height: 34 cm Movement: (!) Decreased     NST: reactive 20 minute tracing, 130 bpm, moderate variability, +accelerations, -decelerations  General:  Alert, oriented and cooperative. Patient is in no acute  distress.  Skin: Skin is warm and dry. No rash noted.   Cardiovascular: Normal heart rate noted  Respiratory: Normal respiratory effort, no problems with respiration noted  Abdomen: Soft, gravid, appropriate for gestational age. Pain/Pressure: Absent     Pelvic:  Cervical exam deferred        Extremities: Normal range of motion.  Edema: None  Mental Status: Normal mood and affect. Normal behavior. Normal judgment and thought content.   Assessment   22 y.o. G1P0 at [redacted]w[redacted]d by  10/25/2022, by Ultrasound presenting for routine prenatal visit  Plan   first Problems (from 04/20/22 to present)     Problem Noted Resolved   Supervision of normal pregnancy 04/20/2022 by Loran Senters, CMA No   Overview Addendum 08/20/2022  9:39 AM by Mirna Mires, CNM     Clinical Staff Provider  Office Location   [facility] Dating  Not found.  Language  English Anatomy US    Flu Vaccine  Needs to get at HD Genetic Screen  NIPS:   TDaP vaccine   offer Hgb A1C or  GTT Early : Third trimester :   Covid declines   LAB RESULTS   Rhogam  O/Positive/-- (12/04 1627)  Blood Type O/Positive/-- (12/04 1627)   Feeding Plan breast Antibody Negative (12/04 1627)  Contraception nexplanon Rubella 4.07 (12/04 1627)  Circumcision yes RPR Non Reactive (12/04 1627)   Pediatrician  [facility] HBsAg Negative (12/04 1627)   Support Person Mom HIV Non Reactive (12/04 1627)  Prenatal Classes yes Varicella     GBS  (For PCN allergy, check sensitivities)   BTL Consent  Hep C Non Reactive (12/04 1627)   VBAC Consent  Pap No results found for: "DIAGPAP"    Hgb Electro      CF      SMA  Rx Zoloft 50 mg Urine culture ordered Iron infusion weekly x3 ordered   Preterm labor symptoms and general obstetric precautions including but not limited to vaginal bleeding, contractions, leaking of fluid and fetal movement were reviewed in detail with the patient. Please refer to After Visit Summary for other  counseling recommendations.   Return in about 2 weeks (around 09/29/2022) for rob.  Tresea Mall, CNM 09/15/2022 3:15 PM

## 2022-09-17 ENCOUNTER — Encounter: Payer: Self-pay | Admitting: Advanced Practice Midwife

## 2022-09-17 LAB — URINE CULTURE

## 2022-09-20 ENCOUNTER — Observation Stay
Admission: EM | Admit: 2022-09-20 | Discharge: 2022-09-20 | Disposition: A | Discharge status: Home / Self Care | Payer: BC Managed Care – PPO | Attending: Obstetrics and Gynecology | Admitting: Obstetrics and Gynecology

## 2022-09-20 DIAGNOSIS — N3001 Acute cystitis with hematuria: Secondary | ICD-10-CM | POA: Diagnosis not present

## 2022-09-20 DIAGNOSIS — M545 Low back pain, unspecified: Secondary | ICD-10-CM | POA: Insufficient documentation

## 2022-09-20 DIAGNOSIS — O26893 Other specified pregnancy related conditions, third trimester: Secondary | ICD-10-CM | POA: Diagnosis present

## 2022-09-20 DIAGNOSIS — N390 Urinary tract infection, site not specified: Secondary | ICD-10-CM | POA: Insufficient documentation

## 2022-09-20 DIAGNOSIS — O2313 Infections of bladder in pregnancy, third trimester: Secondary | ICD-10-CM

## 2022-09-20 DIAGNOSIS — Z3A35 35 weeks gestation of pregnancy: Secondary | ICD-10-CM | POA: Diagnosis not present

## 2022-09-20 DIAGNOSIS — O368330 Maternal care for abnormalities of the fetal heart rate or rhythm, third trimester, not applicable or unspecified: Secondary | ICD-10-CM | POA: Diagnosis not present

## 2022-09-20 DIAGNOSIS — O2340 Unspecified infection of urinary tract in pregnancy, unspecified trimester: Secondary | ICD-10-CM | POA: Diagnosis not present

## 2022-09-20 HISTORY — DX: Urinary tract infection, site not specified: N39.0

## 2022-09-20 MED ORDER — NITROFURANTOIN MONOHYD MACRO 100 MG PO CAPS: 100.0000 mg | ORAL_CAPSULE | Freq: Two times a day (BID) | ORAL | 0 refills | Status: AC

## 2022-09-20 MED ORDER — LACTATED RINGERS IV SOLN: INTRAVENOUS | Status: DC

## 2022-09-20 MED ORDER — LIDOCAINE HCL (PF) 1 % IJ SOLN: 30.0000 mL | INTRAMUSCULAR | Status: DC | PRN

## 2022-09-20 MED ORDER — OXYTOCIN BOLUS FROM INFUSION: 333.0000 mL | Freq: Once | INTRAVENOUS | Status: DC

## 2022-09-20 MED ORDER — LACTATED RINGERS IV SOLN: 500.0000 mL | INTRAVENOUS | Status: DC | PRN

## 2022-09-20 MED ORDER — ACETAMINOPHEN 325 MG PO TABS: 650.0000 mg | ORAL_TABLET | ORAL | Status: DC | PRN

## 2022-09-20 MED ORDER — OXYTOCIN-SODIUM CHLORIDE 30-0.9 UT/500ML-% IV SOLN: 2.5000 [IU]/h | INTRAVENOUS | Status: DC

## 2022-09-20 MED ORDER — NITROFURANTOIN MONOHYD MACRO 100 MG PO CAPS
100.0000 mg | ORAL_CAPSULE | Freq: Two times a day (BID) | ORAL | Status: DC
  Administered 2022-09-20: 100 mg via ORAL
  Filled 2022-09-20: qty 1

## 2022-09-20 NOTE — OB Triage Note (Signed)
Pt arrived to unit wheeled by ED staff with complaints of tight cramping in right upper flank area. Pt is [redacted]w[redacted]d and G1P0. Pt reports no active vaginal bleeding or ROM. Pt reports active fetal movement. Pt reports intercourse within last 24 hours. Pt history reviewed. Pt notified of belongings policy. F[facility] at bedside. Will notify provider of patient's arrival.

## 2022-09-20 NOTE — OB Triage Note (Signed)
macrobid 100mg  given po (see MAR) and patient educated on the importance of continuing 2 times daily for 7 days. Patient encouraged to pick up meds from [facility].   Patient to be discharged to home and encouraged to follow up with outpatient OB as scheduled. Patient encouraged to increase iron. Patient left unit ambulatory with father of baby at bedside to be driven home in private passenger car.

## 2022-09-20 NOTE — Discharge Summary (Signed)
   L&D OB Triage Note  SUBJECTIVE Tasha Oneal is a 22 y.o. G1P0 female at [redacted]w[redacted]d, EDD Estimated Date of Delivery: 10/25/22 who presented to triage with complaints of back pain.   OB History  Gravida Para Term Preterm AB Living  1 0 0 0 0 0  SAB IAB Ectopic Multiple Live Births  0 0 0 0 0    # Outcome Date GA Lbr Len/2nd Weight Sex Delivery Anes PTL Lv  1 Current             Facility-Administered Medications Prior to Admission  Medication Dose Route Frequency Provider Last Rate Last Admin   iron dextran complex (INFED) 250 mg in sodium chloride 0.9 % 500 mL IVPB  250 mg Intravenous Weekly Tresea Mall, CNM       Medications Prior to Admission  Medication Sig Dispense Refill Last Dose   prenatal vitamin w/FE, FA (NATACHEW) 29-1 MG CHEW chewable tablet Chew 2 tablets by mouth daily at 12 noon.      sertraline (ZOLOFT) 50 MG tablet Take 1 tablet (50 mg total) by mouth daily. 30 tablet 3    valACYclovir (VALTREX) 500 MG tablet Take 1 tablet (500 mg total) by mouth daily. 90 tablet 5      OBJECTIVE  Nursing Evaluation:   BP 108/72   Pulse 84   Resp 16   LMP 12/18/2021 (Exact Date)    Findings:   previous U/A cw UTI strep      NST was performed and has been reviewed by me.  NST INTERPRETATION: Category I Baseline 118 15X15 accels  external doppler No decels No ctx noted                  ASSESSMENT Impression:  1.  Pregnancy:  G1P0 at [redacted]w[redacted]d , EDD Estimated Date of Delivery: 10/25/22 2.  Reassuring fetal and maternal status 3.  UTI   PLAN 1. Current condition and above findings reviewed.  Reassuring fetal and maternal condition. 2. Discharge home with standard labor precautions given to return to L&D or call the office for problems. 3. Continue routine prenatal care. 4. Give one macrobid here and discharge with 14 day course,.

## 2022-09-22 ENCOUNTER — Telehealth: Payer: Self-pay

## 2022-09-22 NOTE — Telephone Encounter (Signed)
Patient states she hasn't heard anything about scheduling of iron infusions discussed at her 09/15/22 visit.

## 2022-09-22 NOTE — Telephone Encounter (Signed)
Reviewed chart. 09/15/22 visit note by Tresea Mall, CNM shows: Iron infusion weekly x3 ordered. Unable to reach patient. Voicemail full. Will send my chart message with details on scheduling of iron infusion.

## 2022-09-24 ENCOUNTER — Other Ambulatory Visit: Payer: Self-pay | Admitting: Advanced Practice Midwife

## 2022-09-24 NOTE — Progress Notes (Signed)
Venofer ordered for same day OB iron replacement

## 2022-09-25 ENCOUNTER — Ambulatory Visit
Admission: RE | Admit: 2022-09-25 | Discharge: 2022-09-25 | Disposition: A | Discharge status: Home / Self Care | Payer: BC Managed Care – PPO | Source: Ambulatory Visit | Attending: Advanced Practice Midwife | Admitting: Advanced Practice Midwife

## 2022-09-25 DIAGNOSIS — N390 Urinary tract infection, site not specified: Secondary | ICD-10-CM | POA: Insufficient documentation

## 2022-09-25 MED ORDER — IRON SUCROSE 500 MG IVPB - SIMPLE MED
300.0000 mg | INTRAVENOUS | Status: DC
  Filled 2022-09-25: qty 275

## 2022-09-25 MED ORDER — SODIUM CHLORIDE 0.9 % IV SOLN
300.0000 mg | INTRAVENOUS | Status: DC
  Administered 2022-09-25: 300 mg via INTRAVENOUS
  Filled 2022-09-25: qty 300

## 2022-09-29 ENCOUNTER — Encounter: Payer: Medicaid Other | Admitting: Certified Nurse Midwife

## 2022-10-01 ENCOUNTER — Other Ambulatory Visit (HOSPITAL_COMMUNITY)
Admission: RE | Admit: 2022-10-01 | Discharge: 2022-10-01 | Disposition: A | Discharge status: Home / Self Care | Payer: BC Managed Care – PPO | Source: Ambulatory Visit | Attending: Certified Nurse Midwife | Admitting: Certified Nurse Midwife

## 2022-10-01 ENCOUNTER — Ambulatory Visit (INDEPENDENT_AMBULATORY_CARE_PROVIDER_SITE_OTHER): Payer: BC Managed Care – PPO | Admitting: Certified Nurse Midwife

## 2022-10-01 VITALS — BP 108/73 | HR 95 | Wt 162.2 lb

## 2022-10-01 DIAGNOSIS — Z3493 Encounter for supervision of normal pregnancy, unspecified, third trimester: Secondary | ICD-10-CM | POA: Diagnosis present

## 2022-10-01 DIAGNOSIS — Z3A36 36 weeks gestation of pregnancy: Secondary | ICD-10-CM | POA: Insufficient documentation

## 2022-10-01 DIAGNOSIS — Z3483 Encounter for supervision of other normal pregnancy, third trimester: Secondary | ICD-10-CM

## 2022-10-01 NOTE — Patient Instructions (Signed)

## 2022-10-01 NOTE — Progress Notes (Signed)
ROB doing well, feeling good movement. Pt state she had her first iron infusion last week, has one this week scheduled. Gbs and cultures self swab done today. Had c/o round ligament pain. Reassurance given. Follow up 1 wk for ROB.   Doreene Burke, CNM

## 2022-10-02 ENCOUNTER — Ambulatory Visit
Admission: RE | Admit: 2022-10-02 | Discharge: 2022-10-02 | Disposition: A | Discharge status: Home / Self Care | Payer: Medicaid Other | Source: Ambulatory Visit | Attending: Advanced Practice Midwife | Admitting: Advanced Practice Midwife

## 2022-10-02 DIAGNOSIS — O99013 Anemia complicating pregnancy, third trimester: Secondary | ICD-10-CM | POA: Insufficient documentation

## 2022-10-02 DIAGNOSIS — Z3A36 36 weeks gestation of pregnancy: Secondary | ICD-10-CM | POA: Diagnosis not present

## 2022-10-02 MED ORDER — SODIUM CHLORIDE 0.9 % IV SOLN
300.0000 mg | Freq: Once | INTRAVENOUS | Status: AC
  Administered 2022-10-02: 300 mg via INTRAVENOUS
  Filled 2022-10-02: qty 300

## 2022-10-05 ENCOUNTER — Other Ambulatory Visit: Payer: Self-pay | Admitting: Certified Nurse Midwife

## 2022-10-05 ENCOUNTER — Encounter: Payer: Self-pay | Admitting: Certified Nurse Midwife

## 2022-10-05 LAB — CERVICOVAGINAL ANCILLARY ONLY
Bacterial Vaginitis (gardnerella): NEGATIVE
Candida Glabrata: NEGATIVE
Candida Vaginitis: POSITIVE — AB
Chlamydia: POSITIVE — AB
Comment: NEGATIVE
Comment: NEGATIVE
Comment: NEGATIVE
Comment: NEGATIVE
Comment: NEGATIVE
Comment: NORMAL
Neisseria Gonorrhea: NEGATIVE
Trichomonas: NEGATIVE

## 2022-10-05 LAB — CULTURE, BETA STREP (GROUP B ONLY): Strep Gp B Culture: NEGATIVE

## 2022-10-05 MED ORDER — FLUCONAZOLE 150 MG PO TABS: 150.0000 mg | ORAL_TABLET | Freq: Once | ORAL | 0 refills | Status: AC

## 2022-10-05 MED ORDER — AZITHROMYCIN 500 MG PO TABS: 1000.0000 mg | ORAL_TABLET | Freq: Once | ORAL | 0 refills | Status: AC

## 2022-10-06 ENCOUNTER — Encounter: Payer: Medicaid Other | Admitting: Advanced Practice Midwife

## 2022-10-07 ENCOUNTER — Other Ambulatory Visit (HOSPITAL_COMMUNITY)
Admission: RE | Admit: 2022-10-07 | Discharge: 2022-10-07 | Disposition: A | Discharge status: Home / Self Care | Payer: BC Managed Care – PPO | Source: Ambulatory Visit | Attending: Certified Nurse Midwife | Admitting: Certified Nurse Midwife

## 2022-10-07 ENCOUNTER — Other Ambulatory Visit: Payer: Self-pay | Admitting: Advanced Practice Midwife

## 2022-10-07 ENCOUNTER — Ambulatory Visit (INDEPENDENT_AMBULATORY_CARE_PROVIDER_SITE_OTHER): Payer: BC Managed Care – PPO | Admitting: Certified Nurse Midwife

## 2022-10-07 ENCOUNTER — Encounter: Payer: Self-pay | Admitting: Certified Nurse Midwife

## 2022-10-07 VITALS — BP 104/69 | HR 76 | Wt 161.9 lb

## 2022-10-07 DIAGNOSIS — Z3403 Encounter for supervision of normal first pregnancy, third trimester: Secondary | ICD-10-CM

## 2022-10-07 DIAGNOSIS — Z113 Encounter for screening for infections with a predominantly sexual mode of transmission: Secondary | ICD-10-CM | POA: Insufficient documentation

## 2022-10-07 DIAGNOSIS — F419 Anxiety disorder, unspecified: Secondary | ICD-10-CM

## 2022-10-07 DIAGNOSIS — Z3A37 37 weeks gestation of pregnancy: Secondary | ICD-10-CM

## 2022-10-07 LAB — POCT URINALYSIS DIPSTICK OB
Bilirubin, UA: NEGATIVE
Blood, UA: NEGATIVE
Glucose, UA: NEGATIVE
Ketones, UA: NEGATIVE
Leukocytes, UA: NEGATIVE
Nitrite, UA: NEGATIVE
POC,PROTEIN,UA: NEGATIVE
Spec Grav, UA: 1.01 (ref 1.010–1.025)
Urobilinogen, UA: 0.2 E.U./dL
pH, UA: 6.5 (ref 5.0–8.0)

## 2022-10-07 MED ORDER — VALACYCLOVIR HCL 1 G PO TABS
1000.0000 mg | ORAL_TABLET | Freq: Every day | ORAL | 5 refills | Status: DC
Start: 2022-10-07 — End: 2022-10-29

## 2022-10-07 MED ORDER — AZITHROMYCIN 500 MG PO TABS: 1000.0000 mg | ORAL_TABLET | Freq: Once | ORAL | 0 refills | Status: AC

## 2022-10-07 NOTE — Addendum Note (Signed)
Addended by: Fonda Kinder on: 10/07/2022 09:25 AM   Modules accepted: Orders

## 2022-10-07 NOTE — Progress Notes (Signed)
ROB doing well, feeling good movement. Discussed positive chlamydia screen , pt states she picked up the pills but lost them . She needs refill. Orders placed for valtrex daily and for Azithromycin. Discussed avoidance of intercourse until she and her partner are both treated . She state she is not longer with her partner so that will not be a problem for her. Discussed that she should sustain with any new partners until she is adequately treated. She verbalizes and agrees. Urine culture sent for TOC.  Follow up 1 wk .   Doreene Burke CNM

## 2022-10-07 NOTE — Patient Instructions (Signed)
Braxton Hicks Contractions  Contractions of the uterus can occur throughout pregnancy, but they are not always a sign that you are in labor. You may have practice contractions called Braxton Hicks contractions. These false labor contractions are sometimes confused with true labor. What are Braxton Hicks contractions? Braxton Hicks contractions are tightening movements that occur in the muscles of the uterus before labor. Unlike true labor contractions, these contractions do not result in opening (dilation) and thinning of the lowest part of the uterus (cervix). Toward the end of pregnancy (32-34 weeks), Braxton Hicks contractions can happen more often and may become stronger. These contractions are sometimes difficult to tell apart from true labor because they can be very uncomfortable. How to tell the difference between true labor and false labor True labor Contractions last 30-70 seconds. Contractions become very regular. Discomfort is usually felt in the top of the uterus, and it spreads to the lower abdomen and low back. Contractions do not go away with walking. Contractions usually become stronger and more frequent. The cervix dilates and gets thinner. False labor Contractions are usually shorter, weaker, and farther apart than true labor contractions. Contractions are usually irregular. Contractions are often felt in the front of the lower abdomen and in the groin. Contractions may go away when you walk around or change positions while lying down. The cervix usually does not dilate or become thin. Sometimes, the only way to tell if you are in true labor is for your health care provider to look for changes in your cervix. Your health care provider will do a physical exam and may monitor your contractions. If you are in true labor, your health care provider will send you home with instructions about when to return to the hospital. You may continue to have Braxton Hicks contractions until you  go into true labor. Follow these instructions at home:  Take over-the-counter and prescription medicines only as told by your health care provider. If Braxton Hicks contractions are making you uncomfortable: Change your position from lying down or resting to walking, or change from walking to resting. Sit and rest in a tub of warm water. Drink enough fluid to keep your urine pale yellow. Dehydration may cause these contractions. Do slow and deep breathing several times an hour. Keep all follow-up visits. This is important. Contact a health care provider if: You have a fever. You have continuous pain in your abdomen. Your contractions become stronger, more regular, and closer together. You pass blood-tinged mucus. Get help right away if: You have fluid leaking or gushing from your vagina. You have bright red blood coming from your vagina. Your baby is not moving inside you as much as it used to. Summary You may have practice contractions called Braxton Hicks contractions. These false labor contractions are sometimes confused with true labor. Braxton Hicks contractions are usually shorter, weaker, farther apart, and less regular than true labor contractions. True labor contractions usually become stronger, more regular, and more frequent. Manage discomfort from Braxton Hicks contractions by changing position, resting in a warm bath, practicing deep breathing, and drinking plenty of water. Keep all follow-up visits. Contact your health care provider if your contractions become stronger, more regular, and closer together. This information is not intended to replace advice given to you by your health care provider. Make sure you discuss any questions you have with your health care provider. Document Revised: 03/11/2020 Document Reviewed: 03/11/2020 Elsevier Patient Education  2023 [employer]  

## 2022-10-08 LAB — URINE CYTOLOGY ANCILLARY ONLY
Bacterial Vaginitis-Urine: NEGATIVE
Candida Urine: NEGATIVE — AB
Candida Urine: POSITIVE — AB
Chlamydia: NEGATIVE
Comment: NEGATIVE
Comment: NEGATIVE
Comment: NORMAL
Neisseria Gonorrhea: NEGATIVE
Trichomonas: NEGATIVE

## 2022-10-09 ENCOUNTER — Ambulatory Visit
Admission: RE | Admit: 2022-10-09 | Discharge: 2022-10-09 | Disposition: A | Discharge status: Home / Self Care | Payer: Medicaid Other | Source: Ambulatory Visit | Attending: Advanced Practice Midwife | Admitting: Advanced Practice Midwife

## 2022-10-09 ENCOUNTER — Other Ambulatory Visit: Payer: Self-pay | Admitting: Certified Nurse Midwife

## 2022-10-09 DIAGNOSIS — Z3A34 34 weeks gestation of pregnancy: Secondary | ICD-10-CM | POA: Insufficient documentation

## 2022-10-09 DIAGNOSIS — O99019 Anemia complicating pregnancy, unspecified trimester: Secondary | ICD-10-CM | POA: Diagnosis present

## 2022-10-09 DIAGNOSIS — O99013 Anemia complicating pregnancy, third trimester: Secondary | ICD-10-CM | POA: Insufficient documentation

## 2022-10-09 MED ORDER — SODIUM CHLORIDE 0.9 % IV SOLN
300.0000 mg | Freq: Once | INTRAVENOUS | Status: AC
  Administered 2022-10-09: 300 mg via INTRAVENOUS
  Filled 2022-10-09: qty 300

## 2022-10-09 MED ORDER — FLUCONAZOLE 150 MG PO TABS: 150.0000 mg | ORAL_TABLET | Freq: Once | ORAL | 0 refills | Status: AC

## 2022-10-14 ENCOUNTER — Ambulatory Visit (INDEPENDENT_AMBULATORY_CARE_PROVIDER_SITE_OTHER): Payer: BC Managed Care – PPO | Admitting: Obstetrics

## 2022-10-14 VITALS — BP 106/73 | HR 92 | Wt 166.0 lb

## 2022-10-14 DIAGNOSIS — Z3403 Encounter for supervision of normal first pregnancy, third trimester: Secondary | ICD-10-CM

## 2022-10-14 DIAGNOSIS — Z23 Encounter for immunization: Secondary | ICD-10-CM

## 2022-10-14 DIAGNOSIS — Z3402 Encounter for supervision of normal first pregnancy, second trimester: Secondary | ICD-10-CM

## 2022-10-14 DIAGNOSIS — Z3A38 38 weeks gestation of pregnancy: Secondary | ICD-10-CM

## 2022-10-14 LAB — POCT URINALYSIS DIPSTICK OB
Bilirubin, UA: NEGATIVE
Blood, UA: NEGATIVE
Glucose, UA: NEGATIVE
Ketones, UA: NEGATIVE
Leukocytes, UA: NEGATIVE
Nitrite, UA: NEGATIVE
POC,PROTEIN,UA: NEGATIVE
Spec Grav, UA: 1.025 (ref 1.010–1.025)
Urobilinogen, UA: 0.2 E.U./dL
pH, UA: 6 (ref 5.0–8.0)

## 2022-10-14 NOTE — Progress Notes (Signed)
ROB at [redacted]w[redacted]d. Active baby. Denies ctx, LOF, and vaginal bleeding. She is feeling better after her iron infusions - more energy, no longer craving ice. The last one was 10/09/22. Will recheck CBC next week. Discussed hospital/IOL routines, when to go to the hospital, what to expect from hospital stay. She took her azithromycin on 10/07/22 and Diflucan a few days later. She is taking daily valacyclovir for suppression. Declines SVE today. RTC in one week.   Glenetta Borg, CNM

## 2022-10-20 ENCOUNTER — Encounter: Payer: Self-pay | Admitting: Obstetrics and Gynecology

## 2022-10-20 ENCOUNTER — Ambulatory Visit (INDEPENDENT_AMBULATORY_CARE_PROVIDER_SITE_OTHER): Payer: Medicaid Other | Admitting: Obstetrics and Gynecology

## 2022-10-20 VITALS — BP 103/62 | HR 97 | Wt 167.7 lb

## 2022-10-20 DIAGNOSIS — Z8619 Personal history of other infectious and parasitic diseases: Secondary | ICD-10-CM

## 2022-10-20 DIAGNOSIS — A568 Sexually transmitted chlamydial infection of other sites: Secondary | ICD-10-CM | POA: Insufficient documentation

## 2022-10-20 DIAGNOSIS — O99013 Anemia complicating pregnancy, third trimester: Secondary | ICD-10-CM

## 2022-10-20 DIAGNOSIS — O26843 Uterine size-date discrepancy, third trimester: Secondary | ICD-10-CM

## 2022-10-20 DIAGNOSIS — O99343 Other mental disorders complicating pregnancy, third trimester: Secondary | ICD-10-CM

## 2022-10-20 DIAGNOSIS — Z3A39 39 weeks gestation of pregnancy: Secondary | ICD-10-CM

## 2022-10-20 DIAGNOSIS — Z3403 Encounter for supervision of normal first pregnancy, third trimester: Secondary | ICD-10-CM

## 2022-10-20 DIAGNOSIS — F09 Unspecified mental disorder due to known physiological condition: Secondary | ICD-10-CM

## 2022-10-20 DIAGNOSIS — O98313 Other infections with a predominantly sexual mode of transmission complicating pregnancy, third trimester: Secondary | ICD-10-CM

## 2022-10-20 HISTORY — DX: Anemia complicating pregnancy, third trimester: O99.013

## 2022-10-20 LAB — POCT URINALYSIS DIPSTICK OB
Bilirubin, UA: NEGATIVE
Blood, UA: NEGATIVE
Glucose, UA: NEGATIVE
Ketones, UA: NEGATIVE
Leukocytes, UA: NEGATIVE
Nitrite, UA: NEGATIVE
POC,PROTEIN,UA: NEGATIVE
Spec Grav, UA: 1.01 (ref 1.010–1.025)
Urobilinogen, UA: 1 E.U./dL
pH, UA: 6.5 (ref 5.0–8.0)

## 2022-10-20 NOTE — Progress Notes (Signed)
ROB [redacted]w[redacted]d: She is doing well. She reports some decreased fetal movement. She has no new concerns today.

## 2022-10-20 NOTE — Progress Notes (Signed)
ROB: Patient is a 22 y.o. G1P0 at [redacted]w[redacted]d who presents for routine OB care.  Pregnancy is complicated by mood disorder (on Zoloft), h/o HSV on Valtrex suppression.  Size less than dates noted on today's exam, patient reports this was also noted at her last visit.  Will order growth ultrasound.  Answered questions regarding if patient goes be on her due date.  Discussed option of IOL by 41 weeks, or sooner based on ultrasound results if indicated.  Notes that she would not desire to schedule an induction this week but will consider scheduling next week if still no signs of labor.  Is okay with medical induction if needed.  Has completed iron infusions anemia, continue to recommend oral iron until delivery.  Patient will also need TOC for recent chlamydia infection at delivery.  RTC in 1 week, will need NST at that visit for postdates.

## 2022-10-20 NOTE — Patient Instructions (Signed)
Natural Cervical Ripening Supplements ? ?Red Raspberry Leaf Tea ?2-4 cups daily.  Start at [redacted] weeks gestation and continue until labor.  ? ? ? ?Evening Primrose Oil (1000 mg) ?Take twice daily - by mouth in the morning and vaginally at bedtime. (If previous C-section, take both doses by mouth) ?Start at 38 weeks and continue until labor ? ? ? ?Blue Cohosh ?1 capsule daily starting at [redacted] weeks gestation ?2 capsules daily starting at [redacted] weeks gestation ?3 capsules daily starting at [redacted] weeks gestation ? ?

## 2022-10-22 ENCOUNTER — Ambulatory Visit
Admission: RE | Admit: 2022-10-22 | Discharge: 2022-10-22 | Disposition: A | Discharge status: Home / Self Care | Payer: BC Managed Care – PPO | Source: Ambulatory Visit | Attending: Obstetrics and Gynecology | Admitting: Obstetrics and Gynecology

## 2022-10-22 DIAGNOSIS — O26843 Uterine size-date discrepancy, third trimester: Secondary | ICD-10-CM | POA: Diagnosis present

## 2022-10-23 ENCOUNTER — Encounter: Payer: Self-pay | Admitting: Obstetrics and Gynecology

## 2022-10-27 ENCOUNTER — Other Ambulatory Visit: Payer: Self-pay | Admitting: Certified Nurse Midwife

## 2022-10-27 ENCOUNTER — Ambulatory Visit (INDEPENDENT_AMBULATORY_CARE_PROVIDER_SITE_OTHER): Payer: BC Managed Care – PPO | Admitting: Certified Nurse Midwife

## 2022-10-27 ENCOUNTER — Ambulatory Visit (INDEPENDENT_AMBULATORY_CARE_PROVIDER_SITE_OTHER): Payer: BC Managed Care – PPO

## 2022-10-27 VITALS — BP 116/79 | HR 103 | Ht 64.0 in | Wt 167.4 lb

## 2022-10-27 VITALS — BP 116/79 | HR 103 | Wt 167.4 lb

## 2022-10-27 DIAGNOSIS — O480 Post-term pregnancy: Secondary | ICD-10-CM | POA: Diagnosis not present

## 2022-10-27 DIAGNOSIS — Z3A40 40 weeks gestation of pregnancy: Secondary | ICD-10-CM

## 2022-10-27 DIAGNOSIS — Z113 Encounter for screening for infections with a predominantly sexual mode of transmission: Secondary | ICD-10-CM

## 2022-10-27 DIAGNOSIS — Z3403 Encounter for supervision of normal first pregnancy, third trimester: Secondary | ICD-10-CM

## 2022-10-27 NOTE — Progress Notes (Signed)
   PRENATAL VISIT NOTE  Subjective:  Tasha Oneal is a 22 y.o. G1P0 at [redacted]w[redacted]d being seen today for ongoing prenatal care.  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  Patient reports no complaints.  Contractions: Not present. Vag. Bleeding: None.  Movement: Present. Denies leaking of fluid.  Desires to schedule IOL as soon as possible, ready to meet baby!  The following portions of the patient's history were reviewed and updated as appropriate: allergies, current medications, past family history, past medical history, past social history, past surgical history and problem list.   Objective:   Vitals:   10/27/22 1016  BP: 116/79  Pulse: (!) 103  Weight: 167 lb 6.4 oz (75.9 kg)   Total weight gain: 40 lb 6.4 oz (18.3 kg) Fetal Status: Fetal Heart Rate (bpm): 125 (RNST) Fundal Height: 39 cm Movement: Present  Presentation: Vertex   General:  Alert, oriented and cooperative. Patient is in no acute distress.  Skin: Skin is warm and dry. No rash noted.   Cardiovascular: Normal heart rate noted  Respiratory: Normal respiratory effort, no problems with respiration noted  Abdomen: Soft, gravid, appropriate for gestational age.  Pain/Pressure: Absent     Pelvic:  Dilation: 3 (membranes swept) Effacement (%): 50 Station: -3  Extremities: Normal range of motion.  Edema: None  Mental Status: Normal mood and affect. Normal behavior. Normal judgment and thought content.   Assessment and Plan:  Pregnancy:  G1P0 at [redacted]w[redacted]d 1. Encounter for supervision of normal first pregnancy in third trimester  2. [redacted] weeks gestation of pregnancy  IOL scheduled 6/12 8am arrival. Membranes swept today. Reviewed process of IOL as well as options for pain management. Will need TOC collected at admission & this was ordered with admit labs. Report not completed for 6/6 ultrasound, images reviewed and calculations plot EFW at 27%.  Term labor symptoms and general obstetric precautions including but not limited to vaginal bleeding, contractions, leaking of fluid and fetal movement were reviewed in detail with the patient. Please refer to After Visit Summary for other counseling recommendations.   No follow-ups on file.  No future appointments.   Tasha Oneal, CNM

## 2022-10-27 NOTE — Patient Instructions (Signed)

## 2022-10-27 NOTE — Patient Instructions (Signed)

## 2022-10-27 NOTE — Progress Notes (Signed)
    NURSE VISIT NOTE  Subjective:    Patient ID: SHERISE GEERDES, female    DOB: 02-05-2001, 22 y.o.   MRN: [id number]  HPI  Patient is a 22 y.o. G1P0 female who presents for fetal monitoring per order from Hildred Laser, MD.   Objective:    BP 116/79   Pulse (!) 103   Ht 5\' 4"  (1.626 m)   Wt 167 lb 6.4 oz (75.9 kg)   LMP 12/18/2021 (Exact Date)   BMI 28.73 kg/m  Estimated Date of Delivery: 10/25/22  [redacted]w[redacted]d  Fetus A Non-Stress Test Interpretation for 10/27/22  Indication: Post Dates  Fetal Heart Rate A Mode: External Baseline Rate (A): 120 bpm Variability: Moderate Accelerations: 15 x 15 Decelerations: None Multiple birth?: No  Uterine Activity Mode: Toco Contraction Frequency (min): rare Contraction Duration (sec): 40 Contraction Quality: Mild Resting Time: Adequate  Interpretation (Fetal Testing) Nonstress Test Interpretation: Reactive Overall Impression: Reassuring for gestational age   Assessment:   1. Post-term pregnancy, 40-42 weeks of gestation   2. [redacted] weeks gestation of pregnancy      Plan:   Results reviewed and discussed with patient by  Hartley Barefoot, CNM.     Rocco Serene, LPN

## 2022-10-28 ENCOUNTER — Other Ambulatory Visit: Payer: Self-pay

## 2022-10-28 ENCOUNTER — Encounter: Payer: Self-pay | Admitting: Certified Nurse Midwife

## 2022-10-28 ENCOUNTER — Encounter: Payer: Self-pay | Admitting: Obstetrics and Gynecology

## 2022-10-28 ENCOUNTER — Inpatient Hospital Stay: Payer: BC Managed Care – PPO | Admitting: Anesthesiology

## 2022-10-28 ENCOUNTER — Inpatient Hospital Stay
Admission: EM | Admit: 2022-10-28 | Discharge: 2022-10-29 | DRG: 806 | Disposition: A | Discharge status: Home / Self Care | Payer: BC Managed Care – PPO | Attending: Obstetrics | Admitting: Obstetrics

## 2022-10-28 DIAGNOSIS — Z3A40 40 weeks gestation of pregnancy: Secondary | ICD-10-CM

## 2022-10-28 DIAGNOSIS — A6000 Herpesviral infection of urogenital system, unspecified: Secondary | ICD-10-CM | POA: Diagnosis present

## 2022-10-28 DIAGNOSIS — O480 Post-term pregnancy: Principal | ICD-10-CM | POA: Diagnosis present

## 2022-10-28 DIAGNOSIS — Z3403 Encounter for supervision of normal first pregnancy, third trimester: Principal | ICD-10-CM

## 2022-10-28 DIAGNOSIS — Z113 Encounter for screening for infections with a predominantly sexual mode of transmission: Secondary | ICD-10-CM

## 2022-10-28 DIAGNOSIS — O9832 Other infections with a predominantly sexual mode of transmission complicating childbirth: Secondary | ICD-10-CM | POA: Diagnosis present

## 2022-10-28 LAB — CBC
HCT: 35.8 % — ABNORMAL LOW (ref 36.0–46.0)
Hemoglobin: 11.5 g/dL — ABNORMAL LOW (ref 12.0–15.0)
MCH: 27.2 pg (ref 26.0–34.0)
MCHC: 32.1 g/dL (ref 30.0–36.0)
MCV: 84.6 fL (ref 80.0–100.0)
Platelets: 225 10*3/uL (ref 150–400)
RBC: 4.23 MIL/uL (ref 3.87–5.11)
RDW: 18.3 % — ABNORMAL HIGH (ref 11.5–15.5)
WBC: 12.6 10*3/uL — ABNORMAL HIGH (ref 4.0–10.5)
nRBC: 0 % (ref 0.0–0.2)

## 2022-10-28 LAB — TYPE AND SCREEN
ABO/RH(D): O POS
Antibody Screen: NEGATIVE

## 2022-10-28 LAB — ABO/RH: ABO/RH(D): O POS

## 2022-10-28 LAB — CHLAMYDIA/NGC RT PCR (ARMC ONLY)
Chlamydia Tr: NOT DETECTED
N gonorrhoeae: NOT DETECTED

## 2022-10-28 LAB — RPR: RPR Ser Ql: NONREACTIVE

## 2022-10-28 MED ORDER — OXYTOCIN 10 UNIT/ML IJ SOLN
INTRAMUSCULAR | Status: AC
  Filled 2022-10-28: qty 2

## 2022-10-28 MED ORDER — ACETAMINOPHEN 325 MG PO TABS: 650.0000 mg | ORAL_TABLET | ORAL | Status: DC | PRN

## 2022-10-28 MED ORDER — LACTATED RINGERS IV SOLN
500.0000 mL | INTRAVENOUS | Status: DC | PRN
  Administered 2022-10-28: 1000 mL via INTRAVENOUS

## 2022-10-28 MED ORDER — EPHEDRINE 5 MG/ML INJ: 10.0000 mg | INTRAVENOUS | Status: DC | PRN

## 2022-10-28 MED ORDER — PRENATAL MULTIVITAMIN CH
1.0000 | ORAL_TABLET | Freq: Every day | ORAL | Status: DC
  Administered 2022-10-29: 1 via ORAL
  Filled 2022-10-28: qty 1

## 2022-10-28 MED ORDER — COCONUT OIL OIL
1.0000 | TOPICAL_OIL | Status: DC | PRN
  Filled 2022-10-28: qty 22.5

## 2022-10-28 MED ORDER — FENTANYL CITRATE (PF) 100 MCG/2ML IJ SOLN
INTRAMUSCULAR | Status: AC
  Filled 2022-10-28: qty 2

## 2022-10-28 MED ORDER — LACTATED RINGERS IV SOLN: 500.0000 mL | Freq: Once | INTRAVENOUS | Status: DC

## 2022-10-28 MED ORDER — SERTRALINE HCL 25 MG PO TABS
50.0000 mg | ORAL_TABLET | Freq: Every day | ORAL | Status: DC
  Administered 2022-10-29: 50 mg via ORAL
  Filled 2022-10-28 (×2): qty 2

## 2022-10-28 MED ORDER — LACTATED RINGERS IV SOLN: INTRAVENOUS | Status: DC

## 2022-10-28 MED ORDER — IBUPROFEN 600 MG PO TABS
600.0000 mg | ORAL_TABLET | Freq: Four times a day (QID) | ORAL | Status: DC
  Administered 2022-10-28 – 2022-10-29 (×4): 600 mg via ORAL
  Filled 2022-10-28 (×4): qty 1

## 2022-10-28 MED ORDER — OXYTOCIN BOLUS FROM INFUSION
333.0000 mL | Freq: Once | INTRAVENOUS | Status: AC
  Administered 2022-10-28: 333 mL via INTRAVENOUS

## 2022-10-28 MED ORDER — FENTANYL-BUPIVACAINE-NACL 0.5-0.125-0.9 MG/250ML-% EP SOLN
12.0000 mL/h | EPIDURAL | Status: DC | PRN
  Administered 2022-10-28: 12 mL/h via EPIDURAL

## 2022-10-28 MED ORDER — ZOLPIDEM TARTRATE 5 MG PO TABS: 5.0000 mg | ORAL_TABLET | Freq: Every evening | ORAL | Status: DC | PRN

## 2022-10-28 MED ORDER — LIDOCAINE HCL (PF) 1 % IJ SOLN
INTRAMUSCULAR | Status: DC | PRN
  Administered 2022-10-28: 2 mL via SUBCUTANEOUS

## 2022-10-28 MED ORDER — SIMETHICONE 80 MG PO CHEW: 80.0000 mg | CHEWABLE_TABLET | ORAL | Status: DC | PRN

## 2022-10-28 MED ORDER — SOD CITRATE-CITRIC ACID 500-334 MG/5ML PO SOLN: 30.0000 mL | ORAL | Status: DC | PRN

## 2022-10-28 MED ORDER — ONDANSETRON HCL 4 MG/2ML IJ SOLN
4.0000 mg | Freq: Four times a day (QID) | INTRAMUSCULAR | Status: DC | PRN
  Administered 2022-10-28: 4 mg via INTRAVENOUS
  Filled 2022-10-28: qty 2

## 2022-10-28 MED ORDER — DIPHENHYDRAMINE HCL 25 MG PO CAPS: 25.0000 mg | ORAL_CAPSULE | Freq: Four times a day (QID) | ORAL | Status: DC | PRN

## 2022-10-28 MED ORDER — TETANUS-DIPHTH-ACELL PERTUSSIS 5-2.5-18.5 LF-MCG/0.5 IM SUSY: 0.5000 mL | PREFILLED_SYRINGE | Freq: Once | INTRAMUSCULAR | Status: DC

## 2022-10-28 MED ORDER — AMMONIA AROMATIC IN INHA
RESPIRATORY_TRACT | Status: AC
  Filled 2022-10-28: qty 10

## 2022-10-28 MED ORDER — PHENYLEPHRINE 80 MCG/ML (10ML) SYRINGE FOR IV PUSH (FOR BLOOD PRESSURE SUPPORT): 80.0000 ug | PREFILLED_SYRINGE | INTRAVENOUS | Status: DC | PRN

## 2022-10-28 MED ORDER — FENTANYL CITRATE (PF) 100 MCG/2ML IJ SOLN
50.0000 ug | INTRAMUSCULAR | Status: DC | PRN
  Administered 2022-10-28: 100 ug via INTRAVENOUS
  Administered 2022-10-28: 50 ug via INTRAVENOUS
  Filled 2022-10-28: qty 2

## 2022-10-28 MED ORDER — MISOPROSTOL 25 MCG QUARTER TABLET: 50.0000 ug | ORAL_TABLET | ORAL | Status: DC | PRN

## 2022-10-28 MED ORDER — FLEET ENEMA 7-19 GM/118ML RE ENEM: 1.0000 | ENEMA | RECTAL | Status: DC | PRN

## 2022-10-28 MED ORDER — ONDANSETRON HCL 4 MG/2ML IJ SOLN: 4.0000 mg | INTRAMUSCULAR | Status: DC | PRN

## 2022-10-28 MED ORDER — OXYTOCIN-SODIUM CHLORIDE 30-0.9 UT/500ML-% IV SOLN: 2.5000 [IU]/h | INTRAVENOUS | Status: DC

## 2022-10-28 MED ORDER — LIDOCAINE HCL (PF) 1 % IJ SOLN
INTRAMUSCULAR | Status: AC
  Filled 2022-10-28: qty 30

## 2022-10-28 MED ORDER — TERBUTALINE SULFATE 1 MG/ML IJ SOLN: 0.2500 mg | Freq: Once | INTRAMUSCULAR | Status: DC | PRN

## 2022-10-28 MED ORDER — DIBUCAINE (PERIANAL) 1 % EX OINT
1.0000 | TOPICAL_OINTMENT | CUTANEOUS | Status: DC | PRN
  Filled 2022-10-28: qty 28

## 2022-10-28 MED ORDER — LIDOCAINE-EPINEPHRINE (PF) 1.5 %-1:200000 IJ SOLN
INTRAMUSCULAR | Status: DC | PRN
  Administered 2022-10-28: 3 mL via EPIDURAL

## 2022-10-28 MED ORDER — DOCUSATE SODIUM 100 MG PO CAPS
100.0000 mg | ORAL_CAPSULE | Freq: Two times a day (BID) | ORAL | Status: DC
  Administered 2022-10-29: 100 mg via ORAL
  Filled 2022-10-28: qty 1

## 2022-10-28 MED ORDER — LIDOCAINE HCL (PF) 1 % IJ SOLN: 30.0000 mL | INTRAMUSCULAR | Status: DC | PRN

## 2022-10-28 MED ORDER — BENZOCAINE-MENTHOL 20-0.5 % EX AERO
1.0000 | INHALATION_SPRAY | CUTANEOUS | Status: DC | PRN
  Filled 2022-10-28: qty 112
  Filled 2022-10-28: qty 56

## 2022-10-28 MED ORDER — DIPHENHYDRAMINE HCL 50 MG/ML IJ SOLN: 12.5000 mg | INTRAMUSCULAR | Status: DC | PRN

## 2022-10-28 MED ORDER — OXYTOCIN-SODIUM CHLORIDE 30-0.9 UT/500ML-% IV SOLN
1.0000 m[IU]/min | INTRAVENOUS | Status: DC
  Administered 2022-10-28: 2 m[IU]/min via INTRAVENOUS
  Filled 2022-10-28: qty 500

## 2022-10-28 MED ORDER — MISOPROSTOL 200 MCG PO TABS
ORAL_TABLET | ORAL | Status: AC
  Filled 2022-10-28: qty 4

## 2022-10-28 MED ORDER — WITCH HAZEL-GLYCERIN EX PADS
1.0000 | MEDICATED_PAD | CUTANEOUS | Status: DC | PRN
  Filled 2022-10-28: qty 100

## 2022-10-28 MED ORDER — FENTANYL-BUPIVACAINE-NACL 0.5-0.125-0.9 MG/250ML-% EP SOLN
EPIDURAL | Status: AC
  Filled 2022-10-28: qty 250

## 2022-10-28 MED ORDER — ONDANSETRON HCL 4 MG PO TABS: 4.0000 mg | ORAL_TABLET | ORAL | Status: DC | PRN

## 2022-10-28 NOTE — Anesthesia Preprocedure Evaluation (Signed)
Anesthesia Evaluation  Patient identified by MRN, date of birth, ID band Patient awake    Reviewed: Allergy & Precautions, H&P , NPO status , Patient's Chart, lab work & pertinent test results  Airway Mallampati: II  TM Distance: >3 FB Neck ROM: full    Dental no notable dental hx.    Pulmonary neg pulmonary ROS, former smoker   Pulmonary exam normal        Cardiovascular Exercise Tolerance: Good negative cardio ROS Normal cardiovascular exam     Neuro/Psych    GI/Hepatic negative GI ROS,,,  Endo/Other    Renal/GU   negative genitourinary   Musculoskeletal   Abdominal   Peds  Hematology  (+) Blood dyscrasia, anemia   Anesthesia Other Findings Past Medical History: 10/20/2022: Anemia during pregnancy in third trimester No date: Herpes genitalia No date: Seasonal allergies 09/20/2022: UTI (urinary tract infection)  Past Surgical History: No date: WISDOM TOOTH EXTRACTION  BMI    Body Mass Index: 28.73 kg/m      Reproductive/Obstetrics (+) Pregnancy                             Anesthesia Physical Anesthesia Plan  ASA: 2  Anesthesia Plan: Epidural   Post-op Pain Management:    Induction:   PONV Risk Score and Plan:   Airway Management Planned:   Additional Equipment:   Intra-op Plan:   Post-operative Plan:   Informed Consent: I have reviewed the patients History and Physical, chart, labs and discussed the procedure including the risks, benefits and alternatives for the proposed anesthesia with the patient or authorized representative who has indicated his/her understanding and acceptance.       Plan Discussed with: Anesthesiologist and CRNA  Anesthesia Plan Comments:        Anesthesia Quick Evaluation

## 2022-10-28 NOTE — Discharge Summary (Signed)
Postpartum Discharge Summary      Patient Name: Tasha Oneal DOB: 15-Jan-2001 MRN: [id number]  Date of admission: 10/28/2022 Delivery date:10/28/2022  Delivering provider: Mirna Mires  Date of discharge: 10/29/2022  Admitting diagnosis: Post term pregnancy, antepartum condition or complication [O48.0] Labor and delivery, indication for care [O75.9] Intrauterine pregnancy: [redacted]w[redacted]d     Secondary diagnosis:  Principal Problem:   Post term pregnancy, antepartum condition or complication Active Problems:   Labor and delivery, indication for care   Postpartum care following vaginal delivery  Additional problems: none    Discharge diagnosis: Term Pregnancy Delivered                                              Post partum procedures: none Augmentation: Pitocin Complications: None  Hospital course: Onset of Labor With Vaginal Delivery      22 y.o. yo G1P0 at [redacted]w[redacted]d was admitted in Latent Labor on 10/28/2022. Labor course was complicated bynothing  Membrane Rupture Time/Date: 7:43 AM ,10/28/2022   Delivery Method:Vaginal, Spontaneous  Episiotomy: None  Lacerations:  Labial  Patient had a postpartum course complicated by none.  She is ambulating, tolerating a regular diet, passing flatus, and urinating well. Patient is discharged home in stable condition on 10/29/22.  Newborn Data: Birth date:10/28/2022  Birth time:1:14 PM  Gender:Female  Living status:Living  Apgars:8 ,9  Weight:3450 g   Magnesium Sulfate received: No BMZ received: No Rhophylac:N/A MMR:No T-DaP:Given prenatally Flu: No Transfusion:No  Physical exam  Vitals:   10/28/22 1947 10/29/22 0021 10/29/22 0409 10/29/22 0835  BP: 104/76 102/65 105/69 101/80  Pulse: 91 73 61 66  Resp: 20 20 20 20   Temp: 98.2 F (36.8 C) 98.2 F (36.8 C) 97.9 F (36.6 C)   TempSrc: Oral Oral Oral   SpO2: 98% 97% 96% 100%  Weight:      Height:       General: alert, cooperative, and no distress Lochia:  appropriate Uterine Fundus: firm Incision: N/A DVT Evaluation: No evidence of DVT seen on physical exam. Labs: Lab Results  Component Value Date   WBC 14.1 (H) 10/29/2022   HGB 10.6 (L) 10/29/2022   HCT 33.4 (L) 10/29/2022   MCV 85.2 10/29/2022   PLT 204 10/29/2022      Latest Ref Rng & Units 03/27/2022   12:04 PM  CMP  Glucose 70 - 99 mg/dL 89   BUN 6 - 20 mg/dL 7   Creatinine 4.09 - 8.11 mg/dL 9.14   Sodium 782 - 956 mmol/L 137   Potassium 3.5 - 5.1 mmol/L 3.8   Chloride 98 - 111 mmol/L 109   CO2 22 - 32 mmol/L 23   Calcium 8.9 - 10.3 mg/dL 9.2    Edinburgh Score:    10/29/2022    9:38 AM  Edinburgh Postnatal Depression Scale Screening Tool  I have been able to laugh and see the funny side of things. 0  I have looked forward with enjoyment to things. 0  I have blamed myself unnecessarily when things went wrong. 0  I have been anxious or worried for no good reason. 1  I have felt scared or panicky for no good reason. 0  Things have been getting on top of me. 0  I have been so unhappy that I have had difficulty sleeping. 0  I have felt sad or  miserable. 0  I have been so unhappy that I have been crying. 0  The thought of harming myself has occurred to me. 0  Edinburgh Postnatal Depression Scale Total 1      After visit meds:  Allergies as of 10/29/2022       Reactions   Peanut-containing Drug Products Hives        Medication List     STOP taking these medications    valACYclovir 1000 MG tablet Commonly known as: Valtrex       TAKE these medications    acetaminophen 325 MG tablet Commonly known as: Tylenol Take 2 tablets (650 mg total) by mouth every 4 (four) hours as needed (for pain scale < 4).   benzocaine-Menthol 20-0.5 % Aero Commonly known as: DERMOPLAST Apply 1 Application topically as needed for irritation (perineal discomfort).   coconut oil Oil Apply 1 Application topically as needed.   dibucaine 1 % Oint Commonly known as:  NUPERCAINAL Place 1 Application rectally as needed for hemorrhoids.   docusate sodium 100 MG capsule Commonly known as: COLACE Take 1 capsule (100 mg total) by mouth 2 (two) times daily.   ibuprofen 600 MG tablet Commonly known as: ADVIL Take 1 tablet (600 mg total) by mouth every 6 (six) hours.   prenatal vitamin w/FE, FA 29-1 MG Chew chewable tablet Chew 2 tablets by mouth daily at 12 noon.   sertraline 50 MG tablet Commonly known as: ZOLOFT TAKE 1 TABLET BY MOUTH EVERY DAY   witch hazel-glycerin pad Commonly known as: TUCKS Apply 1 Application topically as needed for hemorrhoids.         Discharge home in stable condition Infant Feeding: Breast Infant Disposition:home with mother Discharge instruction: per After Visit Summary and Postpartum booklet. Activity: Advance as tolerated. Pelvic rest for 6 weeks.  Diet: routine diet Anticipated Birth Control: Nexplanon Postpartum Appointment:6 weeks Additional Postpartum F/U: Postpartum Depression checkup Future Appointments:No future appointments. Follow up Visit:  Follow-up Information     Mirna Mires, CNM. Schedule an appointment as soon as possible for a visit in 6 week(s).   Specialties: Obstetrics, Gynecology Why: Please call the office and make a virtual 2 week post delivery call with Claris Che. Make a 6 week post delivery appointment for a physical and to address birth control. If you are considering the Nexplanon or an IUD, tell the scheduler at the office and this will be done at the 6 week Postpartum visit. Contact information: [address] [place] [address] [phone]                     10/29/2022 Dominica Severin, CNM

## 2022-10-28 NOTE — Anesthesia Procedure Notes (Signed)
Epidural Patient location during procedure: OB Start time: 10/28/2022 7:53 AM End time: 10/28/2022 7:10 AM  Staffing Anesthesiologist: Foye Deer, MD Performed: anesthesiologist   Preanesthetic Checklist Completed: patient identified, IV checked, site marked, risks and benefits discussed, surgical consent, monitors and equipment checked, pre-op evaluation and timeout performed  Epidural Patient position: sitting Prep: ChloraPrep Patient monitoring: heart rate, continuous pulse ox and blood pressure Approach: midline Location: L3-L4 Injection technique: LOR saline  Needle:  Needle type: Tuohy  Needle gauge: 18 G Needle length: 9 cm Needle insertion depth: 5 cm Catheter type: closed end Catheter size: 20 Guage Catheter at skin depth: 10 cm Test dose: negative and 1.5% lidocaine with Epi 1:200 K  Assessment Events: blood not aspirated, no cerebrospinal fluid, injection not painful, no injection resistance and no paresthesia  Additional Notes Reason for block:procedure for pain

## 2022-10-28 NOTE — Progress Notes (Signed)
Tasha Oneal is a 22 y.o. G1P0 at [redacted]w[redacted]d who has continued under labor management. Her labor has been augmented with pitocin and she now has an excellent contraction  pattern.  Subjective  She is feeling some rectal pressure. The epidural is working well for her. Her position has been changed often to facilitate optimal fetal position.   Objective: BP 119/79   Pulse 79   Temp 98 F (36.7 C) (Oral)   Resp 18   Ht 5\' 4"  (1.626 m)   Wt 75.9 kg   LMP 12/18/2021 (Exact Date)   SpO2 98%   BMI 28.73 kg/m  No intake/output data recorded. Total I/O In: -  Out: 600 [Urine:600]  FHT:  FHR: 115 bpm, variability: minimal to moderate,  accelerations:  Present,  decelerations:  Present occasional "dips", early decels UC:   regular, every 2-3 minutes SVE:   Dilation: 9 Effacement (%): 90 Station: Plus 2 Exam by:: Liana Crocker CNM More cervix palpated from 0600 to 0100 ; on her right side Labs: Lab Results  Component Value Date   WBC 12.6 (H) 10/28/2022   HGB 11.5 (L) 10/28/2022   HCT 35.8 (L) 10/28/2022   MCV 84.6 10/28/2022   PLT 225 10/28/2022    Assessment / Plan: Augmentation of labor, progressing well  Fetal Wellbeing:  Category II Pain Control:  Epidural I/D:  n/a Anticipated MOD:  NSVD  jPatient repositioned using the peanut ball. Will recheck in 45 minutes with plan to commence pushing if she is completely dilated.  Mirna Mires, CNM 10/28/2022, 11:51 AM

## 2022-10-28 NOTE — Progress Notes (Signed)
Tasha Oneal is a 22 y.o. G1P0 at [redacted]w[redacted]d by ultrasound admitted for active labor  Subjective: She is very comfortable and sleeping after having received an epidural.has recently received also an IV bolus, as her blood pressures are lower post epidural placement.   Objective: BP (!) 88/48 (BP Location: Left Arm) Comment: pt asymptomatic  Pulse 80   Temp 98 F (36.7 C) (Oral)   Resp 18   Ht 5\' 4"  (1.626 m)   Wt 75.9 kg   LMP 12/18/2021 (Exact Date)   SpO2 96%   BMI 28.73 kg/m  No intake/output data recorded. No intake/output data recorded.  FHT:  FHR: 115 bpm, variability: minimal to moderate variability,  accelerations:  Present,  decelerations:  Absent UC:   irregular, every 2-6 minutes SVE:   Dilation: 5 Effacement (%): 90 Station: 0 Exam by:: Swanson CNM  Labs: Lab Results  Component Value Date   WBC 12.6 (H) 10/28/2022   HGB 11.5 (L) 10/28/2022   HCT 35.8 (L) 10/28/2022   MCV 84.6 10/28/2022   PLT 225 10/28/2022    Assessment / Plan: Active labor, with inadequate contraction pattern. Will augment with pitocin.    Fetal Wellbeing:  Category I Pain Control:  Epidural I/D:  n/a Anticipated MOD:  NSVD  Will recheck in several hours after contraction frequency is q 3 minutes or PRN.  Mirna Mires, CNM 10/28/2022, 9:00 AM

## 2022-10-28 NOTE — Progress Notes (Signed)
Patient sitting up for epidural from 0655 to 0708, RN at bedside. EFM and TOCO will be reapplied after epidural is finished.

## 2022-10-29 ENCOUNTER — Encounter: Payer: Self-pay | Admitting: Certified Nurse Midwife

## 2022-10-29 LAB — CBC
HCT: 33.4 % — ABNORMAL LOW (ref 36.0–46.0)
Hemoglobin: 10.6 g/dL — ABNORMAL LOW (ref 12.0–15.0)
MCH: 27 pg (ref 26.0–34.0)
MCHC: 31.7 g/dL (ref 30.0–36.0)
MCV: 85.2 fL (ref 80.0–100.0)
Platelets: 204 10*3/uL (ref 150–400)
RBC: 3.92 MIL/uL (ref 3.87–5.11)
RDW: 18.3 % — ABNORMAL HIGH (ref 11.5–15.5)
WBC: 14.1 10*3/uL — ABNORMAL HIGH (ref 4.0–10.5)
nRBC: 0 % (ref 0.0–0.2)

## 2022-10-29 MED ORDER — DIBUCAINE (PERIANAL) 1 % EX OINT: 1.0000 | TOPICAL_OINTMENT | CUTANEOUS | Status: DC | PRN

## 2022-10-29 MED ORDER — BENZOCAINE-MENTHOL 20-0.5 % EX AERO: 1.0000 | INHALATION_SPRAY | CUTANEOUS | Status: DC | PRN

## 2022-10-29 MED ORDER — COCONUT OIL OIL: 1.0000 | TOPICAL_OIL | 0 refills | Status: DC | PRN

## 2022-10-29 MED ORDER — WITCH HAZEL-GLYCERIN EX PADS: 1.0000 | MEDICATED_PAD | CUTANEOUS | 12 refills | Status: DC | PRN

## 2022-10-29 MED ORDER — IBUPROFEN 600 MG PO TABS: 600.0000 mg | ORAL_TABLET | Freq: Four times a day (QID) | ORAL | 0 refills | Status: DC

## 2022-10-29 MED ORDER — ACETAMINOPHEN 325 MG PO TABS: 650.0000 mg | ORAL_TABLET | ORAL | Status: DC | PRN

## 2022-10-29 MED ORDER — DOCUSATE SODIUM 100 MG PO CAPS: 100.0000 mg | ORAL_CAPSULE | Freq: Two times a day (BID) | ORAL | 0 refills | Status: DC

## 2022-10-29 NOTE — Progress Notes (Signed)
Pt discharged with infant.  Discharge instructions, prescriptions and follow up appointment given to and reviewed with pt. Pt verbalized understanding. Escorted out by staff. 

## 2022-10-29 NOTE — Anesthesia Postprocedure Evaluation (Signed)
Anesthesia Post Note  Patient: Tasha Oneal  Procedure(s) Performed: AN AD HOC LABOR EPIDURAL  Patient location during evaluation: Mother Baby Anesthesia Type: Epidural Level of consciousness: awake and alert Pain management: pain level controlled Vital Signs Assessment: post-procedure vital signs reviewed and stable Respiratory status: spontaneous breathing, nonlabored ventilation and respiratory function stable Cardiovascular status: stable Postop Assessment: no headache, no backache, epidural receding and able to ambulate Anesthetic complications: no   No notable events documented.   Last Vitals:  Vitals:   10/29/22 0021 10/29/22 0409  BP: 102/65 105/69  Pulse: 73 61  Resp: 20 20  Temp: 36.8 C 36.6 C  SpO2: 97% 96%    Last Pain:  Vitals:   10/29/22 0510  TempSrc:   PainSc: 0-No pain                 Rosanne Gutting

## 2022-10-29 NOTE — Lactation Note (Signed)
This note was copied from a baby's chart. Lactation Consultation Note  Patient Name: Tasha Oneal [id number] Date: 10/29/2022 Age:20 hours Reason for consult: Initial assessment;Term;1st time breastfeeding  Lactation to the room for initial visit. Mother is finger feeding the baby colostrum she brought into hospital. Encouraged feeding on demand and with cues. If baby is not cueing encouraged hand expression and skin to skin. Mother is aware how to hand express. Encouraged 8 or more attempts in the first 24 hours and 8 or more good feeds after 24 HOL. Mother states baby latches well without pain or damage. Reviewed appropriate diapers for days of life and How to know your baby is getting enough to eat. Reviewed "Understanding Postpartum and Newborn Care" booklet at bedside. Discussed outpatient resources for lactation. [facility] # left on board, encouraged to call for any assistance. Mother has no further questions at this time.   Maternal Data Has patient been taught Hand Expression?: Yes Does the patient have breastfeeding experience prior to this delivery?: No  Feeding Mother's Current Feeding Choice: Breast Milk  Interventions Interventions: Breast feeding basics reviewed;Education  Discharge Discharge Education: Engorgement and breast care;Warning signs for feeding baby Pump: Personal (has a Mom cozy and spectra at home)  Consult Status Consult Status: PRN    Tasha Oneal 10/29/2022, 12:18 PM

## 2022-10-30 NOTE — H&P (Signed)
History and Physical   HPI  Tasha Oneal is a 22 y.o. G1P0 at [redacted]w[redacted]d Estimated Date of Delivery: 10/25/22 who is being admitted for labor. She was scheduled for postdates IOL this morning, but she began having contractions at home around 0130. Her cervix changed to 4.5 cm over two hours. She had a chlamydia infection 10/01/22; TOC was negative today. She has been taking valacyclovir for HSV suppression.   OB History  OB History  Gravida Para Term Preterm AB Living  1 0 0 0 0 0  SAB IAB Ectopic Multiple Live Births  0 0 0 0 0    # Outcome Date GA Lbr Len/2nd Weight Sex Delivery Anes PTL Lv  1 Current             PROBLEM LIST  Pregnancy complications or risks: Patient Active Problem List   Diagnosis Date Noted   Post term pregnancy, antepartum condition or complication 10/28/2022   Labor and delivery, indication for care 10/28/2022   Post-term pregnancy, 40-42 weeks of gestation 10/27/2022   Chlamydia trachomatis infection in pregnancy in third trimester 10/20/2022   History of herpes genitalis 10/20/2022   Supervision of normal pregnancy 04/20/2022   Adjustment disorder with mixed disturbance of emotions and conduct 11/17/2020   Major depressive disorder, recurrent episode, mild with anxious distress (HCC) 11/17/2020    Prenatal labs and studies: ABO, Rh: --/--/PENDING (06/12 0557) Antibody: NEG (06/12 0357) Rubella: 4.07 (12/04 1627) RPR: Non Reactive (04/04 0939)  HBsAg: Negative (12/04 1627)  HIV: Non Reactive (04/04 0939)  UJW:JXBJYNWG/-- (05/16 1423)   Past Medical History:  Diagnosis Date   Anemia during pregnancy in third trimester 10/20/2022   Herpes genitalia    Seasonal allergies    UTI (urinary tract infection) 09/20/2022     Past Surgical History:  Procedure Laterality Date   WISDOM TOOTH EXTRACTION       Medications    Current Discharge Medication List     CONTINUE these medications which have NOT CHANGED   Details  prenatal vitamin  w/FE, FA (NATACHEW) 29-1 MG CHEW chewable tablet Chew 2 tablets by mouth daily at 12 noon.    sertraline (ZOLOFT) 50 MG tablet TAKE 1 TABLET BY MOUTH EVERY DAY Qty: 90 tablet, Refills: 2   Associated Diagnoses: Encounter for supervision of normal first pregnancy in third trimester; Anxiety    valACYclovir (VALTREX) 1000 MG tablet Take 1 tablet (1,000 mg total) by mouth daily. Qty: 30 tablet, Refills: 5         Allergies  Peanut-containing drug products  Review of Systems  Constitutional: negative Eyes: negative Ears, nose, mouth, throat, and face: negative Respiratory: negative Cardiovascular: negative Gastrointestinal: negative Genitourinary:uterine contractions Musculoskeletal:negative Behavioral/Psych: negative  Physical Exam  BP 117/79 (BP Location: Left Arm)   Pulse 80   Temp 97.8 F (36.6 C) (Oral)   Ht 5\' 4"  (1.626 m)   Wt 75.9 kg   LMP 12/18/2021 (Exact Date)   BMI 28.73 kg/m   General: Lungs:  CTAB Cardio: RRR without M/R/G Abd: Soft, gravid, NT Presentation: cephalic No HSV lesions visualized CERVIX: Dilation: 4.5 Effacement (%): 80 Station: -1 Exam by:: Quitman Livings, CNM  See Prenatal records for more detailed PE.     FHR:  Baseline: 115 Variability: moderate Accelerations present Decelerations: none Toco: irregular, every 2-5 minutes Category 1  Test Results  Results for orders placed or performed during the hospital encounter of 10/28/22 (from the past 24 hour(s))  Type and screen  Status: None   Collection Time: 10/28/22  3:57 AM  Result Value Ref Range   ABO/RH(D) O POS    Antibody Screen NEG    Sample Expiration      10/31/2022,2359 Performed at [facility], [address] [place], [place] [address]   Chlamydia/NGC rt PCR [facility] only)     Status: None   Collection Time: 10/28/22  3:57 AM   Specimen: Urine  Result Value Ref Range   Specimen source GC/Chlam URINE, RANDOM    Chlamydia Tr NOT DETECTED NOT  DETECTED   N gonorrhoeae NOT DETECTED NOT DETECTED  CBC     Status: Abnormal   Collection Time: 10/28/22  3:59 AM  Result Value Ref Range   WBC 12.6 (H) 4.0 - 10.5 K/uL   RBC 4.23 3.87 - 5.11 MIL/uL   Hemoglobin 11.5 (L) 12.0 - 15.0 g/dL   HCT 60.4 (L) 54.0 - 98.1 %   MCV 84.6 80.0 - 100.0 fL   MCH 27.2 26.0 - 34.0 pg   MCHC 32.1 30.0 - 36.0 g/dL   RDW 19.1 (H) 47.8 - 29.5 %   Platelets 225 150 - 400 K/uL   nRBC 0.0 0.0 - 0.2 %  ABO/Rh     Status: None (Preliminary result)   Collection Time: 10/28/22  5:57 AM  Result Value Ref Range   ABO/RH(D) PENDING    Group B Strep negative  Assessment   G1P0 at [redacted]w[redacted]d Estimated Date of Delivery: 10/25/22  Reassuring maternal/fetal status. Early labor  Patient Active Problem List   Diagnosis Date Noted   Post term pregnancy, antepartum condition or complication 10/28/2022   Labor and delivery, indication for care 10/28/2022   Post-term pregnancy, 40-42 weeks of gestation 10/27/2022   Chlamydia trachomatis infection in pregnancy in third trimester 10/20/2022   History of herpes genitalis 10/20/2022   Supervision of normal pregnancy 04/20/2022   Adjustment disorder with mixed disturbance of emotions and conduct 11/17/2020   Major depressive disorder, recurrent episode, mild with anxious distress (HCC) 11/17/2020    Plan  1. Admit to L&D  2. EFM: Per protocol -- Category 1 3. Pharmacologic pain relief if desired.   4. Admission labs  5. Anticipate NSVD 6. MD notified of admission  Guadlupe Spanish, [facility] 10/28/2022 6:45 AM

## 2022-11-04 ENCOUNTER — Other Ambulatory Visit: Payer: Self-pay

## 2022-11-04 NOTE — Telephone Encounter (Signed)
Pt calling for refill of zoloft 50mg .  [phone] Adv pt she should have refills on file at the pharmacy for 26m; J[facility] sent in rx in 09/2022 #90 c 2 refills.  Pt appreciative.

## 2022-11-13 ENCOUNTER — Telehealth: Payer: Self-pay

## 2022-11-13 NOTE — Telephone Encounter (Signed)
Noted  

## 2022-11-18 ENCOUNTER — Telehealth: Payer: Self-pay

## 2022-11-18 ENCOUNTER — Encounter: Payer: Self-pay | Admitting: Emergency Medicine

## 2022-11-18 DIAGNOSIS — Z91010 Allergy to peanuts: Secondary | ICD-10-CM | POA: Diagnosis not present

## 2022-11-18 DIAGNOSIS — D72829 Elevated white blood cell count, unspecified: Secondary | ICD-10-CM | POA: Insufficient documentation

## 2022-11-18 DIAGNOSIS — E876 Hypokalemia: Secondary | ICD-10-CM | POA: Insufficient documentation

## 2022-11-18 DIAGNOSIS — N390 Urinary tract infection, site not specified: Secondary | ICD-10-CM | POA: Insufficient documentation

## 2022-11-18 DIAGNOSIS — F53 Postpartum depression: Secondary | ICD-10-CM | POA: Insufficient documentation

## 2022-11-18 DIAGNOSIS — E860 Dehydration: Secondary | ICD-10-CM | POA: Diagnosis not present

## 2022-11-18 DIAGNOSIS — R42 Dizziness and giddiness: Secondary | ICD-10-CM | POA: Diagnosis present

## 2022-11-18 DIAGNOSIS — F330 Major depressive disorder, recurrent, mild: Secondary | ICD-10-CM | POA: Diagnosis not present

## 2022-11-18 LAB — CBC
HCT: 40.1 % (ref 36.0–46.0)
Hemoglobin: 12.8 g/dL (ref 12.0–15.0)
MCH: 27.1 pg (ref 26.0–34.0)
MCHC: 31.9 g/dL (ref 30.0–36.0)
MCV: 84.8 fL (ref 80.0–100.0)
Platelets: 320 10*3/uL (ref 150–400)
RBC: 4.73 MIL/uL (ref 3.87–5.11)
RDW: 16.3 % — ABNORMAL HIGH (ref 11.5–15.5)
WBC: 13.1 10*3/uL — ABNORMAL HIGH (ref 4.0–10.5)
nRBC: 0 % (ref 0.0–0.2)

## 2022-11-18 LAB — COMPREHENSIVE METABOLIC PANEL
ALT: 18 U/L (ref 0–44)
AST: 18 U/L (ref 15–41)
Albumin: 4.1 g/dL (ref 3.5–5.0)
Alkaline Phosphatase: 92 U/L (ref 38–126)
Anion gap: 13 (ref 5–15)
BUN: 11 mg/dL (ref 6–20)
CO2: 16 mmol/L — ABNORMAL LOW (ref 22–32)
Calcium: 8.7 mg/dL — ABNORMAL LOW (ref 8.9–10.3)
Chloride: 104 mmol/L (ref 98–111)
Creatinine, Ser: 0.67 mg/dL (ref 0.44–1.00)
GFR, Estimated: >60 mL/min (ref >60)
Glucose, Bld: 86 mg/dL (ref 70–99)
Potassium: 3.2 mmol/L — ABNORMAL LOW (ref 3.5–5.1)
Sodium: 133 mmol/L — ABNORMAL LOW (ref 135–145)
Total Bilirubin: 0.8 mg/dL (ref 0.3–1.2)
Total Protein: 7.7 g/dL (ref 6.5–8.1)

## 2022-11-18 LAB — TROPONIN I (HIGH SENSITIVITY): Troponin I (High Sensitivity): 2 ng/L (ref <18)

## 2022-11-18 LAB — LIPASE, BLOOD: Lipase: 28 U/L (ref 11–51)

## 2022-11-18 NOTE — ED Triage Notes (Signed)
Pt post partum since vaginal delivery on 6/12. Pt reports since, decrease in appetite and oral intake, dizziness, chills, and increased fatigue. Pt currently breast feeding. Pt also currently on Zoloft.

## 2022-11-19 ENCOUNTER — Emergency Department
Admission: EM | Admit: 2022-11-19 | Discharge: 2022-11-19 | Disposition: A | Discharge status: Home / Self Care | Payer: BC Managed Care – PPO | Attending: Emergency Medicine | Admitting: Emergency Medicine

## 2022-11-19 DIAGNOSIS — E876 Hypokalemia: Secondary | ICD-10-CM

## 2022-11-19 DIAGNOSIS — R42 Dizziness and giddiness: Secondary | ICD-10-CM

## 2022-11-19 DIAGNOSIS — F330 Major depressive disorder, recurrent, mild: Secondary | ICD-10-CM | POA: Diagnosis present

## 2022-11-19 DIAGNOSIS — F32A Depression, unspecified: Secondary | ICD-10-CM

## 2022-11-19 DIAGNOSIS — F53 Postpartum depression: Secondary | ICD-10-CM

## 2022-11-19 DIAGNOSIS — N390 Urinary tract infection, site not specified: Secondary | ICD-10-CM

## 2022-11-19 DIAGNOSIS — E860 Dehydration: Secondary | ICD-10-CM

## 2022-11-19 DIAGNOSIS — R630 Anorexia: Secondary | ICD-10-CM | POA: Insufficient documentation

## 2022-11-19 DIAGNOSIS — R5383 Other fatigue: Secondary | ICD-10-CM

## 2022-11-19 LAB — URINALYSIS, ROUTINE W REFLEX MICROSCOPIC
Bilirubin Urine: NEGATIVE
Glucose, UA: NEGATIVE mg/dL
Hgb urine dipstick: NEGATIVE
Ketones, ur: 20 mg/dL — AB
Nitrite: NEGATIVE
Protein, ur: NEGATIVE mg/dL
Specific Gravity, Urine: 1.024 (ref 1.005–1.030)
pH: 5 (ref 5.0–8.0)

## 2022-11-19 MED ORDER — CEPHALEXIN 500 MG PO CAPS: 500.0000 mg | ORAL_CAPSULE | Freq: Three times a day (TID) | ORAL | 0 refills | Status: DC

## 2022-11-19 MED ORDER — SODIUM CHLORIDE 0.9 % IV SOLN
1.0000 g | Freq: Once | INTRAVENOUS | Status: AC
  Administered 2022-11-19: 1 g via INTRAVENOUS
  Filled 2022-11-19: qty 10

## 2022-11-19 MED ORDER — IBUPROFEN 600 MG PO TABS
600.0000 mg | ORAL_TABLET | Freq: Once | ORAL | Status: AC
  Administered 2022-11-19: 600 mg via ORAL
  Filled 2022-11-19: qty 1

## 2022-11-19 MED ORDER — POTASSIUM CHLORIDE CRYS ER 20 MEQ PO TBCR
40.0000 meq | EXTENDED_RELEASE_TABLET | Freq: Once | ORAL | Status: AC
  Administered 2022-11-19: 40 meq via ORAL
  Filled 2022-11-19: qty 2

## 2022-11-19 MED ORDER — SODIUM CHLORIDE 0.9 % IV BOLUS
1000.0000 mL | Freq: Once | INTRAVENOUS | Status: AC
  Administered 2022-11-19: 1000 mL via INTRAVENOUS

## 2022-11-19 MED ORDER — DEXTROSE-SODIUM CHLORIDE 5-0.45 % IV SOLN: INTRAVENOUS | Status: DC

## 2022-11-19 NOTE — ED Provider Notes (Signed)
[facility] Provider Note    Event Date/Time   First MD Initiated Contact with Patient 11/19/22 [phone]     (approximate)   History   Dizziness   HPI  Tasha Oneal is a 22 y.o. female who presents to the ED from home with a chief complaint of depression, decreased appetite and oral intake, dizziness and fatigue.  Patient is postpartum status post NSVD on 10/28/2022.  She is currently breast-feeding.  Taking Zoloft for the past 2 months prescribed by her OB doctor.  Boyfriend left for [place] training and he is gone 5 days at a time.  When he is home, he is a very hands-on father.  Her mother helps her out every other day.  Patient does feel more depressed but denies active SI/HI/AH/VH.  Has no desire to harm the baby.  Physically she just feels fatigued all the time.  Tries to eat and drink but does not desire to.  Sometimes feels dizzy.  Denies fever, cough, chest pain, cracked nipples, shortness of breath, abdominal pain, nausea, vomiting, dysuria or diarrhea.  Denies vaginal bleeding, notes it is now just a brownish discharge.     Past Medical History   Past Medical History:  Diagnosis Date   Anemia during pregnancy in third trimester 10/20/2022   Herpes genitalia    Seasonal allergies    UTI (urinary tract infection) 09/20/2022     Active Problem List   Patient Active Problem List   Diagnosis Date Noted   Post term pregnancy, antepartum condition or complication 10/28/2022   Labor and delivery, indication for care 10/28/2022   Postpartum care following vaginal delivery 10/28/2022   Post-term pregnancy, 40-42 weeks of gestation 10/27/2022   Chlamydia trachomatis infection in pregnancy in third trimester 10/20/2022   History of herpes genitalis 10/20/2022   Supervision of normal pregnancy 04/20/2022   Adjustment disorder with mixed disturbance of emotions and conduct 11/17/2020   Major depressive disorder, recurrent episode, mild with anxious distress  (HCC) 11/17/2020     Past Surgical History   Past Surgical History:  Procedure Laterality Date   WISDOM TOOTH EXTRACTION       Home Medications   Prior to Admission medications   Medication Sig Start Date End Date Taking? Authorizing Provider  cephALEXin (KEFLEX) 500 MG capsule Take 1 capsule (500 mg total) by mouth 3 (three) times daily. 11/19/22  Yes Irean Hong, MD  acetaminophen (TYLENOL) 325 MG tablet Take 2 tablets (650 mg total) by mouth every 4 (four) hours as needed (for pain scale < 4). 10/29/22   Dominica Severin, CNM  benzocaine-Menthol (DERMOPLAST) 20-0.5 % AERO Apply 1 Application topically as needed for irritation (perineal discomfort). 10/29/22   Dominica Severin, CNM  coconut oil OIL Apply 1 Application topically as needed. 10/29/22   Dominica Severin, CNM  dibucaine (NUPERCAINAL) 1 % OINT Place 1 Application rectally as needed for hemorrhoids. 10/29/22   Dominica Severin, CNM  docusate sodium (COLACE) 100 MG capsule Take 1 capsule (100 mg total) by mouth 2 (two) times daily. 10/29/22   Dominica Severin, CNM  ibuprofen (ADVIL) 600 MG tablet Take 1 tablet (600 mg total) by mouth every 6 (six) hours. 10/29/22   Dominica Severin, CNM  prenatal vitamin w/FE, FA (NATACHEW) 29-1 MG CHEW chewable tablet Chew 2 tablets by mouth daily at 12 noon.    [provider]  sertraline (ZOLOFT) 50 MG tablet TAKE 1 TABLET BY MOUTH EVERY DAY  10/08/22   Tresea Mall, CNM  witch hazel-glycerin (TUCKS) pad Apply 1 Application topically as needed for hemorrhoids. 10/29/22   Dominica Severin, CNM  dexmethylphenidate (FOCALIN) 10 MG tablet Take 20 mg by mouth 1 day or 1 dose. Patient not taking: Reported on 11/13/2020  11/17/20  [provider]     Allergies  Peanut-containing drug products   Family History   Family History  Problem Relation Age of Onset   Healthy Mother    Healthy Father    Healthy Brother    Healthy Brother    Healthy Brother     Healthy Brother    Healthy Maternal Grandmother    Healthy Maternal Grandfather    Healthy Sister    Healthy Sister    Healthy Sister    Healthy Sister    Healthy Sister    Healthy Sister      Physical Exam  Triage Vital Signs: ED Triage Vitals  Enc Vitals Group     BP 11/18/22 2216 101/66     Pulse Rate 11/18/22 2216 98     Resp 11/18/22 2216 18     Temp 11/18/22 2216 98.7 F (37.1 C)     Temp Source 11/18/22 2216 Oral     SpO2 11/18/22 2216 97 %     Weight 11/18/22 2217 144 lb (65.3 kg)     Height 11/18/22 2217 5\' 4"  (1.626 m)     Head Circumference --      Peak Flow --      Pain Score --      Pain Loc --      Pain Edu? --      Excl. in GC? --     Updated Vital Signs: BP 105/67   Pulse 66   Temp 98.8 F (37.1 C) (Oral)   Resp 14   Ht 5\' 4"  (1.626 m)   Wt 65.3 kg   LMP 12/18/2021 (Exact Date)   SpO2 98%   Breastfeeding Yes   BMI 24.72 kg/m    General: Awake, no distress.  Mildly dry mucous membranes.  Appears tired. CV:  RRR.  Good peripheral perfusion.  Resp:  Normal effort.  CTAB. Abd:  Nontender.  No distention.  Other:  Normal affect.   ED Results / Procedures / Treatments  Labs (all labs ordered are listed, but only abnormal results are displayed) Labs Reviewed  COMPREHENSIVE METABOLIC PANEL - Abnormal; Notable for the following components:      Result Value   Sodium 133 (*)    Potassium 3.2 (*)    CO2 16 (*)    Calcium 8.7 (*)    All other components within normal limits  CBC - Abnormal; Notable for the following components:   WBC 13.1 (*)    RDW 16.3 (*)    All other components within normal limits  URINALYSIS, ROUTINE W REFLEX MICROSCOPIC - Abnormal; Notable for the following components:   Color, Urine YELLOW (*)    APPearance HAZY (*)    Ketones, ur 20 (*)    Leukocytes,Ua MODERATE (*)    Bacteria, UA RARE (*)    All other components within normal limits  URINE CULTURE  LIPASE, BLOOD  TROPONIN I (HIGH SENSITIVITY)      EKG  ED ECG REPORT I, Phuoc Huy J, the attending physician, personally viewed and interpreted this ECG.   Date: 11/19/2022  EKG Time: 2231  Rate: 86  Rhythm: normal sinus rhythm  Axis: Normal  Intervals:none  ST&T Change:  Nonspecific    RADIOLOGY None   Official radiology report(s): No results found.   PROCEDURES:  Critical Care performed: No  Procedures   MEDICATIONS ORDERED IN ED: Medications  dextrose 5 % and 0.45 % NaCl infusion ( Intravenous Stopped 11/19/22 0428)  sodium chloride 0.9 % bolus 1,000 mL (0 mLs Intravenous Stopped 11/19/22 0650)  potassium chloride SA (KLOR-CON M) CR tablet 40 mEq (40 mEq Oral Given 11/19/22 0125)  cefTRIAXone (ROCEPHIN) 1 g in sodium chloride 0.9 % 100 mL IVPB (0 g Intravenous Stopped 11/19/22 0349)     IMPRESSION / MDM / ASSESSMENT AND PLAN / ED COURSE  I reviewed the triage vital signs and the nursing notes.                             22 year old female presenting with chiefly fatigue and depression status post vaginal delivery 3 weeks ago.  Differential diagnosis includes but is not limited to anemia, infectious, metabolic, behavioral health etiologies, etc.  I personally reviewed patient's records and note her delivery note from 10/28/2022.  Patient's presentation is most consistent with acute complicated illness / injury requiring diagnostic workup.  Laboratory results demonstrate normal hemoglobin 12.8, mild hypokalemia with potassium 3.2, troponin and EKG unremarkable.  Does not need second troponin, will cancel.  Awaiting urine specimen.  Will administer IV fluids, repeat potassium.  Offered and patient accepted behavioral medicine consult.  Clinical Course as of 11/19/22 0715  Thu Nov 19, 2022  0211 UA demonstrates leukocyte positive UTI with ketonuria.  Will start IV Rocephin, D5 one half normal saline infusion. [JS]  [id number] Patient has been evaluated by TTS.  She was notified and will be most likely dayshift before she  can be seen by [facility] nurse practitioner as they are experiencing increased volumes tonight.  Patient understands this and would desire to stay to speak with NP.  She is resting comfortably, denies SI/HI. [JS]  [phone] Care will be signed out to the oncoming provider at change of shift pending psychiatric evaluation.  Anticipate patient may be discharged home if cleared by psychiatry. [JS]    Clinical Course User Index [JS] Irean Hong, MD     FINAL CLINICAL IMPRESSION(S) / ED DIAGNOSES   Final diagnoses:  Dizziness  Fatigue due to depression  Dehydration  Hypokalemia  Lower urinary tract infectious disease  Postpartum depression     Rx / DC Orders   ED Discharge Orders          Ordered    cephALEXin (KEFLEX) 500 MG capsule  3 times daily        11/19/22 0656             Note:  This document was prepared using Dragon voice recognition software and may include unintentional dictation errors.   Irean Hong, MD 11/19/22 [phone]

## 2022-11-19 NOTE — ED Notes (Signed)
Orthostatic Vitals negative.

## 2022-11-19 NOTE — BH Assessment (Signed)
Comprehensive Clinical Assessment (CCA) Note  11/19/2022 Tasha Oneal [id number]  Chief Complaint: Patient is a 22 year old female presenting to [facility] ED voluntarily. Per triage note Pt post partum since vaginal delivery on 6/12. Pt reports since, decrease in appetite and oral intake, dizziness, chills, and increased fatigue. Pt currently breast feeding. Pt also currently on Zoloft. During assessment patient appears alert and oriented x4, calm and cooperative, patient is presenting with her mother and 81 week old newborn. Patient reports she is presenting to the ED due to "exhaustion, we had a big change recently." Patient reports that she lives with her baby's father and she reports that he recently got a new job where he is gone for 5 days out of the week driving trucks. Patient's mother reports that this was the patient's first time being home alone with the baby without the support of her baby's father, but patient's mother lives close and is supportive towards the patient. Patient reports no sleep and no appetite, she also reports not having a current therapist. Patient reports 1 past hospitalization 2 years ago where she attempted suicide via cutting. Patient reports since then she has not cut herself., she reports that she was admitted here but per her chart review there was no admission to behavioral medicine 2 years ago, could have possibly been a reassess and discharge the following day. Patient denies current SI/HI/AH/VH Chief Complaint  Patient presents with   Dizziness   Visit Diagnosis: Depression    CCA Screening, Triage and Referral (STR)  Patient Reported Information How did you hear about [place]? Self  Referral name: No data recorded Referral phone number: No data recorded  Whom do you see for routine medical problems? No data recorded Practice/Facility Name: No data recorded Practice/Facility Phone Number: No data recorded Name of Contact: No data recorded Contact Number: No  data recorded Contact Fax Number: No data recorded Prescriber Name: No data recorded Prescriber Address (if known): No data recorded  What Is the Reason for Your Visit/Call Today? Pt post partum since vaginal delivery on 6/12. Pt reports since, decrease in appetite and oral intake, dizziness, chills, and increased fatigue. Pt currently breast feeding. Pt also currently on Zoloft.  How Long Has This Been Causing You Problems? 1-6 months  What Do You Feel Would Help You the Most Today? No data recorded  Have You Recently Been in Any Inpatient Treatment (Hospital/Detox/Crisis Center/28-Day Program)? No data recorded Name/Location of Program/Hospital:No data recorded How Long Were You There? No data recorded When Were You Discharged? No data recorded  Have You Ever Received Services From [facility] Before? No data recorded Who Do You See at [facility]? No data recorded  Have You Recently Had Any Thoughts About Hurting Yourself? No  Are You Planning to Commit Suicide/Harm Yourself At This time? No   Have you Recently Had Thoughts About Hurting Someone Karolee Ohs? No  Explanation: No data recorded  Have You Used Any Alcohol or Drugs in the Past 24 Hours? No  How Long Ago Did You Use Drugs or Alcohol? No data recorded What Did You Use and How Much? No data recorded  Do You Currently Have a Therapist/Psychiatrist? No  Name of Therapist/Psychiatrist: No data recorded  Have You Been Recently Discharged From Any Office Practice or Programs? No  Explanation of Discharge From Practice/Program: No data recorded    CCA Screening Triage Referral Assessment Type of Contact: Face-to-Face  Is this Initial or Reassessment? No data recorded Date Telepsych consult  ordered in [facility]:  No data recorded Time Telepsych consult ordered in [facility]:  No data recorded  Patient Reported Information Reviewed? No data recorded Patient Left Without Being Seen? No data recorded Reason for Not Completing  Assessment: No data recorded  Collateral Involvement: No data recorded  Does Patient Have a Court Appointed Legal Guardian? No data recorded Name and Contact of Legal Guardian: No data recorded If Minor and Not Living with Parent(s), Who has Custody? No data recorded Is CPS involved or ever been involved? Never  Is APS involved or ever been involved? Never   Patient Determined To Be At Risk for Harm To Self or Others Based on Review of Patient Reported Information or Presenting Complaint? No  Method: No data recorded Availability of Means: No data recorded Intent: No data recorded Notification Required: No data recorded Additional Information for Danger to Others Potential: No data recorded Additional Comments for Danger to Others Potential: No data recorded Are There Guns or Other Weapons in Your Home? No  Types of Guns/Weapons: No data recorded Are These Weapons Safely Secured?                            No data recorded Who Could Verify You Are Able To Have These Secured: No data recorded Do You Have any Outstanding Charges, Pending Court Dates, Parole/Probation? No data recorded Contacted To Inform of Risk of Harm To Self or Others: No data recorded  Location of Assessment: [facility] ED   Does Patient Present under Involuntary Commitment? No  IVC Papers Initial File Date: No data recorded  [place] of Residence: [place]   Patient Currently Receiving the Following Services: No data recorded  Determination of Need: Emergent (2 hours)   Options For Referral: No data recorded    CCA Biopsychosocial Intake/Chief Complaint:  No data recorded Current Symptoms/Problems: No data recorded  Patient Reported Schizophrenia/Schizoaffective Diagnosis in Past: No   Strengths: Patient is able to communicate her needs and has a good support system  Preferences: No data recorded Abilities: No data recorded  Type of Services Patient Feels are Needed: No data recorded  Initial  Clinical Notes/Concerns: No data recorded  Mental Health Symptoms Depression:   Fatigue; Increase/decrease in appetite; Sleep (too much or little)   Duration of Depressive symptoms:  Greater than two weeks   Mania:   None   Anxiety:    Fatigue; Restlessness   Psychosis:   None   Duration of Psychotic symptoms: No data recorded  Trauma:   None   Obsessions:   None   Compulsions:   None   Inattention:   None   Hyperactivity/Impulsivity:   None   Oppositional/Defiant Behaviors:   None   Emotional Irregularity:   None   Other Mood/Personality Symptoms:  No data recorded   Mental Status Exam Appearance and self-care  Stature:   Average   Weight:   Average weight   Clothing:   Casual   Grooming:   Normal   Cosmetic use:   None   Posture/gait:   Normal   Motor activity:   Not Remarkable   Sensorium  Attention:   Normal   Concentration:   Normal   Orientation:   X5   Recall/memory:   Normal   Affect and Mood  Affect:   Appropriate   Mood:   Other (Comment)   Relating  Eye contact:   Normal   Facial expression:   Responsive  Attitude toward examiner:   Cooperative   Thought and Language  Speech flow:  Clear and Coherent   Thought content:   Appropriate to Mood and Circumstances   Preoccupation:   None   Hallucinations:   None   Organization:  No data recorded  [employer] of Knowledge:   Good   Intelligence:   Average   Abstraction:   Normal   Judgement:   Good   Reality Testing:   Realistic   Insight:   Good   Decision Making:   Normal   Social Functioning  Social Maturity:   Responsible   Social Judgement:   Normal   Stress  Stressors:   Transitions   Coping Ability:   Normal   Skill Deficits:   None   Supports:   Family; Friends/Service system     Religion: Religion/Spirituality Are You A Religious Person?: No  Leisure/Recreation: Leisure /  Recreation Do You Have Hobbies?: No  Exercise/Diet: Exercise/Diet Do You Exercise?: No Have You Gained or Lost A Significant Amount of Weight in the Past Six Months?: No Do You Follow a Special Diet?: No Do You Have Any Trouble Sleeping?: Yes Explanation of Sleeping Difficulties: Patient reports no sleep   CCA Employment/Education Employment/Work Situation: Employment / Work Situation Employment Situation: Unemployed Patient's Job has Been Impacted by Current Illness: No Has Patient ever Been in Equities trader?: No  Education: Education Is Patient Currently Attending School?: No Did You Have An Individualized Education Program (IIEP): No Did You Have Any Difficulty At [employer]?: No Patient's Education Has Been Impacted by Current Illness: No   CCA Family/Childhood History Family and Relationship History: Family history Marital status: Long term relationship Long term relationship, how long?: Unknown What types of issues is patient dealing with in the relationship?: None reported Additional relationship information: None Does patient have children?: Yes How many children?: 1 How is patient's relationship with their children?: Patient is a new mom and has a good relationship with her daughter  Childhood History:  Childhood History By whom was/is the patient raised?: Mother Did patient suffer any verbal/emotional/physical/sexual abuse as a child?: No Did patient suffer from severe childhood neglect?: No Has patient ever been sexually abused/assaulted/raped as an adolescent or adult?: No Was the patient ever a victim of a crime or a disaster?: No Witnessed domestic violence?: No Has patient been affected by domestic violence as an adult?: No  Child/Adolescent Assessment:     CCA Substance Use Alcohol/Drug Use: Alcohol / Drug Use Pain Medications: See MAR Prescriptions: See MAR Over the Counter: See MAR History of alcohol / drug use?: No history of alcohol / drug  abuse                         ASAM's:  Six Dimensions of Multidimensional Assessment  Dimension 1:  Acute Intoxication and/or Withdrawal Potential:      Dimension 2:  Biomedical Conditions and Complications:      Dimension 3:  Emotional, Behavioral, or Cognitive Conditions and Complications:     Dimension 4:  Readiness to Change:     Dimension 5:  Relapse, Continued use, or Continued Problem Potential:     Dimension 6:  Recovery/Living Environment:     ASAM Severity Score:    ASAM Recommended Level of Treatment:     Substance use Disorder (SUD)    Recommendations for Services/Supports/Treatments:    DSM5 Diagnoses: Patient Active Problem List   Diagnosis Date Noted  Post term pregnancy, antepartum condition or complication 10/28/2022   Labor and delivery, indication for care 10/28/2022   Postpartum care following vaginal delivery 10/28/2022   Post-term pregnancy, 40-42 weeks of gestation 10/27/2022   Chlamydia trachomatis infection in pregnancy in third trimester 10/20/2022   History of herpes genitalis 10/20/2022   Supervision of normal pregnancy 04/20/2022   Adjustment disorder with mixed disturbance of emotions and conduct 11/17/2020   Major depressive disorder, recurrent episode, mild with anxious distress (HCC) 11/17/2020    Patient Centered Plan: Patient is on the following Treatment Plan(s):  Depression   Referrals to Alternative Service(s): Referred to Alternative Service(s):   Place:   Date:   Time:    Referred to Alternative Service(s):   Place:   Date:   Time:    Referred to Alternative Service(s):   Place:   Date:   Time:    Referred to Alternative Service(s):   Place:   Date:   Time:      @[facility] @  [employer], LCAS-A

## 2022-11-19 NOTE — Discharge Instructions (Addendum)
You were seen in the ER for your fatigue.  Your urine was concerning for infection.  I have included a prescription for treatment of this.  Please use the resources provided by our mental health team.  Return to the ER for new or worsening symptoms.

## 2022-11-19 NOTE — ED Provider Notes (Signed)
Care of this patient assumed from prior physician at 0700 pending psychiatry consultation and disposition. Please see prior physician note for further details.  Briefly this is a 22 year old female who presents with fatigue and depressive symptoms after recent childbirth.  Her lab work demonstrated mild hypokalemia.  She was given IV fluids.  Urinalysis concerning for infection, she was given Rocephin and a urine culture was sent. She is evaluated by TTS and given outpatient resources.  Pending psychiatry consultation in the morning.  Case was discussed with NP Willeen Cass.  She evaluated the patient and did feel the patient to be cleared from psychiatry.  Patient reevaluated.  She is comfortable discharge home.  Prescription for Keflex sent by prior provider.  She was discharged in stable condition.   [name], MD 11/19/22 1019

## 2022-11-20 DIAGNOSIS — R5383 Other fatigue: Secondary | ICD-10-CM

## 2022-11-20 DIAGNOSIS — R630 Anorexia: Secondary | ICD-10-CM | POA: Insufficient documentation

## 2022-11-20 LAB — URINE CULTURE: Culture: <10000 — AB

## 2022-11-20 NOTE — Consult Note (Signed)
[facility] Face-to-Face Psychiatry Consult   Reason for Consult:  Postpartum depression  Referring Physician:  Friendship Sink. Dolores Frame, MD Patient Identification: Tasha Oneal MRN:  [id number] Principal Diagnosis: Fatigue Diagnosis:  Principal Problem:   Fatigue Active Problems:   Major depressive disorder, recurrent episode, mild with anxious distress (HCC)   Decreased appetite   Total Time spent with patient: 45 minutes   HPI: Patient seen face to face by this provider, consulted with emergency department physician Dr. Rosalia Hammers; and chart reviewed on 11/19/22.  Tasha Oneal, 22 y.o., female with a past psychiatric history of depression presented voluntarily to [facility] ED with chief complaints of  decreased appetite and oral intake, dizziness, chills, and increased fatigue. She is postpartum following a vaginal delivery on 6-12. On evaluation Tasha Oneal reports she has not been feeling well and has had difficulty eating. She reports a history of decreased appetite. She says her appetite improved during her pregnancy but decreased again after giving birth. She currently denies experiencing depressive symptoms, feelings of hopelessness, worthlessness, or guilt. She does endorse fatigue, which is expected after giving birth. This is her first child. She reports her partner is currently away for training and this is her first time alone with the newborn. She says when her partner is home, he is very involved with caring for the baby. She reports a history of  periods of irritability and agitation, which she is prescribed Zoloft by her OBGYN. She is unable to identify specific triggers that contribute to her mood instability. She emphasizes that despite any physical or emotional challenges, she has no intentions of harming the baby.   Patient reports 1 past suicide attempt with self injurious behavior, "2 years ago today". She states she was upset after a break-up with a previous boyfriend. She reports 1 psychiatric  hospitalization; However on chart review it was discovered that the patient presented to [facility] ED on 11/16/20 due to self mutilating behavior; she was discharged on 11/17/20 with recommendations and resources to seek therapy with [facility]. She is currently not connected with therapy services. She denies history of trauma, abuse or neglect. She is taking Zoloft  50 mg daily for "mood stability", prescribed by her OB/GYN. She states that she is compliant with her medication. Patient resides in [place] with the father of her child. She reports feeling safe at home. She is employed, but currently out on maternity leave. She identifies her support system as her partner, parents, and maternal grandparents. She denies access to firearms. Patient denies illicit substance use. She denies recent alcohol use, but reports daily shots (2-3) of Cogn[place] liquor prior to her pregnancy. PDMP Review revealed 0 active prescriptions.   During the evaluation Tasha Oneal is seated on a stretcher in no acute distress. She is alert, oriented x 4, pleasant, calm and cooperative. She engages easily in conversation. Objectively there is no evidence of psychosis/mania or delusional thinking. Patient is able to converse coherently, goal directed thoughts, no distractibility, or pre-occupation. She also denies suicidal/self-harm/homicidal ideation, psychosis, and paranoia. Patient answered questions appropriately. Patient gives consent to speak with her mother, Tasha Oneal.  Collateral: The psychiatry team spoke with the patent's mother, Tasha Oneal, separate from patient. Patient's mother reports that the decreased appetite began around the time of the COVID pandemic. She reports at that time, the patient was taking a stimulant medication for ADHD, which is known to suppress appetite. She reports the patient self-discontinued the medication due to not liking the way it made her feel.  She confirms the patient's appetite improved during her  pregnancy but decreased again after giving birth. Patient's mother confirms the patient's boyfriend is away working, leaving her alone with the newborn for the first time. She reports she lives close by and offered for patient to stay the night. Patient's mother reports her boyfriend will be back in town today and confirmed that he is supportive. She reports the patient is aware that she can call her and/or her granparents at any time for assistance.    Past Psychiatric History: ADHD, Depression  Risk to Self:  No  Risk to Others:  No  Prior Inpatient Therapy:  No  Prior Outpatient Therapy:  Yes  Past Medical History:  Past Medical History:  Diagnosis Date   Anemia during pregnancy in third trimester 10/20/2022   Herpes genitalia    Seasonal allergies    UTI (urinary tract infection) 09/20/2022    Past Surgical History:  Procedure Laterality Date   WISDOM TOOTH EXTRACTION     Family History:  Family History  Problem Relation Age of Onset   Healthy Mother    Healthy Father    Healthy Brother    Healthy Brother    Healthy Brother    Healthy Brother    Healthy Maternal Grandmother    Healthy Maternal Grandfather    Healthy Sister    Healthy Sister    Healthy Sister    Healthy Sister    Healthy Sister    Healthy Sister    Family Psychiatric  History: Patient reports family history of depression and anxiety in maternal grandmother.  Social History:  Social History   Substance and Sexual Activity  Alcohol Use Not Currently     Social History   Substance and Sexual Activity  Drug Use Never    Social History   Socioeconomic History   Marital status: Single    Spouse name: Not on file   Number of children: 0   Years of education: 12   Highest education level: Not on file  Occupational History   Occupation: Materials engineer  Tobacco Use   Smoking status: Former    Types: Cigars    Quit date: 12/01/2021    Years since quitting: 0.9   Smokeless tobacco: Never   Vaping Use   Vaping Use: Former   Start date: 11/13/2016   Quit date: 02/24/2022   Substances: Nicotine, Flavoring  Substance and Sexual Activity   Alcohol use: Not Currently   Drug use: Never   Sexual activity: Yes    Partners: Male    Birth control/protection: Condom, Implant  Other Topics Concern   Not on file  Social History Narrative   Not on file   Social Determinants of Health   Financial Resource Strain: Medium Risk (04/20/2022)   Overall Financial Resource Strain (CARDIA)    Difficulty of Paying Living Expenses: Somewhat hard  Food Insecurity: No Food Insecurity (10/28/2022)   Hunger Vital Sign    Worried About Running Out of Food in the Last Year: Never true    Ran Out of Food in the Last Year: Never true  Transportation Needs: No Transportation Needs (10/28/2022)   PRAPARE - Administrator, Civil Service (Medical): No    Lack of Transportation (Non-Medical): No  Physical Activity: Insufficiently Active (04/20/2022)   Exercise Vital Sign    Days of Exercise per Week: 7 days    Minutes of Exercise per Session: 20 min  Stress: No Stress Concern Present (04/20/2022)  [employer] of Occupational Health - Occupational Stress Questionnaire    Feeling of Stress : Not at all  Social Connections: Unknown (04/20/2022)   Social Connection and Isolation Panel [NHANES]    Frequency of Communication with Friends and Family: More than three times a week    Frequency of Social Gatherings with Friends and Family: Twice a week    Attends Religious Services: Never    Database administrator or Organizations: No    Attends Banker Meetings: Never    Marital Status: Not on file   Additional Social History:    Allergies:   Allergies  Allergen Reactions   Peanut-Containing Drug Products Hives    Labs:  Results for orders placed or performed during the hospital encounter of 11/19/22 (from the past 48 hour(s))  Lipase, blood     Status: None    Collection Time: 11/18/22 10:29 PM  Result Value Ref Range   Lipase 28 11 - 51 U/L    Comment: Performed at [facility], [address] [place], [place] [address]  Comprehensive metabolic panel     Status: Abnormal   Collection Time: 11/18/22 10:29 PM  Result Value Ref Range   Sodium 133 (L) 135 - 145 mmol/L   Potassium 3.2 (L) 3.5 - 5.1 mmol/L   Chloride 104 98 - 111 mmol/L   CO2 16 (L) 22 - 32 mmol/L   Glucose, Bld 86 70 - 99 mg/dL    Comment: Glucose reference range applies only to samples taken after fasting for at least 8 hours.   BUN 11 6 - 20 mg/dL   Creatinine, Ser 6.04 0.44 - 1.00 mg/dL   Calcium 8.7 (L) 8.9 - 10.3 mg/dL   Total Protein 7.7 6.5 - 8.1 g/dL   Albumin 4.1 3.5 - 5.0 g/dL   AST 18 15 - 41 U/L   ALT 18 0 - 44 U/L   Alkaline Phosphatase 92 38 - 126 U/L   Total Bilirubin 0.8 0.3 - 1.2 mg/dL   GFR, Estimated >54 >09 mL/min    Comment: (NOTE) Calculated using the CKD-EPI Creatinine Equation (2021)    Anion gap 13 5 - 15    Comment: Performed at [facility], [address] [place], [place] [address]  CBC     Status: Abnormal   Collection Time: 11/18/22 10:29 PM  Result Value Ref Range   WBC 13.1 (H) 4.0 - 10.5 K/uL   RBC 4.73 3.87 - 5.11 MIL/uL   Hemoglobin 12.8 12.0 - 15.0 g/dL   HCT 47.8 29.5 - 62.1 %   MCV 84.8 80.0 - 100.0 fL   MCH 27.1 26.0 - 34.0 pg   MCHC 31.9 30.0 - 36.0 g/dL   RDW 30.8 (H) 65.7 - 84.6 %   Platelets 320 150 - 400 K/uL   nRBC 0.0 0.0 - 0.2 %    Comment: Performed at [facility], [address] [place], [place] [address]  Troponin I (High Sensitivity)     Status: None   Collection Time: 11/18/22 10:29 PM  Result Value Ref Range   Troponin I (High Sensitivity) 2 <18 ng/L    Comment: (NOTE) Elevated high sensitivity troponin I (hsTnI) values and significant  changes across serial measurements may suggest ACS but many other  chronic and acute conditions are known to elevate hsTnI results.  Refer  to the "Links" section for chest pain algorithms and additional  guidance. Performed at [facility], [address] [place], [place] [address]   Urinalysis, Routine w reflex microscopic -Urine, Clean Catch     Status: Abnormal   Collection Time: 11/19/22  1:25 AM  Result Value Ref Range   Color, Urine YELLOW (A) YELLOW   APPearance HAZY (A) CLEAR   Specific Gravity, Urine 1.024 1.005 - 1.030   pH 5.0 5.0 - 8.0   Glucose, UA NEGATIVE NEGATIVE mg/dL   Hgb urine dipstick NEGATIVE NEGATIVE   Bilirubin Urine NEGATIVE NEGATIVE   Ketones, ur 20 (A) NEGATIVE mg/dL   Protein, ur NEGATIVE NEGATIVE mg/dL   Nitrite NEGATIVE NEGATIVE   Leukocytes,Ua MODERATE (A) NEGATIVE   RBC / HPF 6-10 0 - 5 RBC/hpf   WBC, UA 11-20 0 - 5 WBC/hpf   Bacteria, UA RARE (A) NONE SEEN   Squamous Epithelial / HPF 0-5 0 - 5 /HPF   Mucus PRESENT     Comment: Performed at [facility], [address] [place], [place] [address]  Urine Culture     Status: Abnormal   Collection Time: 11/19/22  1:25 AM   Specimen: Urine, Clean Catch  Result Value Ref Range   Specimen Description      URINE, CLEAN CATCH Performed at [facility], [address], [place], [place] [address]    Special Requests      NONE Performed at [facility], [address], [place], [place] [address]    Culture (A)     <10,000 COLONIES/mL INSIGNIFICANT GROWTH Performed at [facility], [address] [place], [place] [address]    Report Status 11/20/2022 FINAL     No current facility-administered medications for this encounter.   Current Outpatient Medications  Medication Sig Dispense Refill   cephALEXin (KEFLEX) 500 MG capsule Take 1 capsule (500 mg total) by mouth 3 (three) times daily. 21 capsule 0   acetaminophen (TYLENOL) 325 MG tablet Take 2 tablets (650 mg total) by mouth every 4 (four) hours as needed (for pain scale < 4).     benzocaine-Menthol (DERMOPLAST) 20-0.5 % AERO Apply 1  Application topically as needed for irritation (perineal discomfort).     coconut oil OIL Apply 1 Application topically as needed.  0   dibucaine (NUPERCAINAL) 1 % OINT Place 1 Application rectally as needed for hemorrhoids.     docusate sodium (COLACE) 100 MG capsule Take 1 capsule (100 mg total) by mouth 2 (two) times daily. 30 capsule 0   ibuprofen (ADVIL) 600 MG tablet Take 1 tablet (600 mg total) by mouth every 6 (six) hours. 30 tablet 0   prenatal vitamin w/FE, FA (NATACHEW) 29-1 MG CHEW chewable tablet Chew 2 tablets by mouth daily at 12 noon.     sertraline (ZOLOFT) 50 MG tablet TAKE 1 TABLET BY MOUTH EVERY DAY 90 tablet 2   witch hazel-glycerin (TUCKS) pad Apply 1 Application topically as needed for hemorrhoids. 40 each 12    Musculoskeletal: Strength & Muscle Tone: within normal limits Gait & Station:  Did not assess Patient leans: N/A            Psychiatric Specialty Exam:  Presentation  General Appearance: Casual  Eye Contact:Good  Speech:Clear and Coherent  Speech Volume:Normal  Handedness:Right   Mood and Affect  Mood:Euthymic  Affect:Appropriate   Thought Process  Thought Processes:Coherent  Descriptions of Associations:Intact  Orientation:Full (Time, Place and Person)  Thought Content:Logical  History of Schizophrenia/Schizoaffective disorder:No  Duration of Psychotic Symptoms:No data recorded Hallucinations:Hallucinations: None  Ideas of Reference:None  Suicidal  Thoughts:Suicidal Thoughts: No  Homicidal Thoughts:Homicidal Thoughts: No   Sensorium  Memory:Recent Good; Immediate Good  Judgment:Good  Insight:Good   Executive Functions  Concentration:Good  Attention Span:Good  Recall:Good  Fund of Knowledge:Good  Language:Good   Psychomotor Activity  Psychomotor Activity:Psychomotor Activity: Normal   Assets  Assets:Communication Skills; Desire for Improvement; Intimacy; Resilience; Social Support   Sleep   Sleep:Sleep: Fair   Physical Exam: Physical Exam Vitals and nursing note reviewed.  HENT:     Head: Normocephalic.     Nose: Nose normal.  Pulmonary:     Effort: Pulmonary effort is normal.  Musculoskeletal:        General: Normal range of motion.     Cervical back: Normal range of motion.  Neurological:     Mental Status: She is alert and oriented to person, place, and time.  Psychiatric:        Attention and Perception: Attention normal. She does not perceive auditory or visual hallucinations.        Mood and Affect: Mood is anxious.        Speech: Speech normal.        Behavior: Behavior normal. Behavior is cooperative.        Thought Content: Thought content normal. Thought content is not paranoid or delusional. Thought content does not include homicidal or suicidal ideation.        Cognition and Memory: Cognition and memory normal.        Judgment: Judgment normal.    Review of Systems  Psychiatric/Behavioral:  The patient is nervous/anxious.   All other systems reviewed and are negative.  Blood pressure 105/67, pulse 66, temperature 98.8 F (37.1 C), temperature source Oral, resp. rate 14, height 5\' 4"  (1.626 m), weight 65.3 kg, last menstrual period 12/18/2021, SpO2 98 %, currently breastfeeding. Body mass index is 24.72 kg/m.  Treatment Plan Summary: Plan : 22 y.o. postpartum female presented to the ED with decreased appetite, dizziness, chills, and increased fatigue. Patient has a history of decreased appetite, which started in 2022 around the time of ADHD treatment with stimulant medication, which she self-discontinued due to side effects. Her appetite reportedly improved during pregnancy but has decreased again postpartum.  A UTI was confirmed during this hospitalization.  Patient is currently on Zoloft 50 mg as prescribed by her OBGYN. No depressive symptoms such as isolating,  hopelessness, worthlessness, or guilt reported at this time. She is not homicidal,  suicidal, nor is there evidence of psychiatric impairment at this time. Fatigue noted, attributed to recent childbirth. Patient advised to rest as much as possible and seek help from her support system, including her significant other, mother, grandparents. Patient is advised to follow up with her OBGYN to discuss her concerns about decrease appetite, fatigue and any other postpartum concerns.     Disposition:  No evidence of imminent risk to self or others at present.   Patient does not meet criteria for psychiatric inpatient admission. Supportive therapy provided about ongoing stressors. Discussed crisis plan, support from social network, calling 911, coming to the Emergency Department, and calling Suicide Hotline.  Norma Fredrickson, NP 11/20/2022 1:46 PM

## 2022-11-23 ENCOUNTER — Ambulatory Visit (INDEPENDENT_AMBULATORY_CARE_PROVIDER_SITE_OTHER): Payer: BC Managed Care – PPO | Admitting: Licensed Practical Nurse

## 2022-11-23 DIAGNOSIS — Z1332 Encounter for screening for maternal depression: Secondary | ICD-10-CM

## 2022-11-23 DIAGNOSIS — F3289 Other specified depressive episodes: Secondary | ICD-10-CM | POA: Diagnosis not present

## 2022-11-23 MED ORDER — SERTRALINE HCL 25 MG PO TABS
25.0000 mg | ORAL_TABLET | Freq: Every day | ORAL | 1 refills | Status: DC
Start: 2022-11-23 — End: 2022-12-08
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
## 2022-12-08 ENCOUNTER — Encounter: Payer: Self-pay | Admitting: Licensed Practical Nurse

## 2022-12-08 ENCOUNTER — Ambulatory Visit (INDEPENDENT_AMBULATORY_CARE_PROVIDER_SITE_OTHER): Payer: BC Managed Care – PPO | Admitting: Licensed Practical Nurse

## 2022-12-08 DIAGNOSIS — Z30017 Encounter for initial prescription of implantable subdermal contraceptive: Secondary | ICD-10-CM

## 2022-12-08 DIAGNOSIS — F3289 Other specified depressive episodes: Secondary | ICD-10-CM

## 2022-12-08 DIAGNOSIS — Z3202 Encounter for pregnancy test, result negative: Secondary | ICD-10-CM

## 2022-12-08 LAB — POCT URINE PREGNANCY: Preg Test, Ur: NEGATIVE

## 2022-12-08 MED ORDER — ETONOGESTREL 68 MG ~~LOC~~ IMPL
68.0000 mg | DRUG_IMPLANT | Freq: Once | SUBCUTANEOUS | Status: AC
  Administered 2022-12-08: 68 mg via SUBCUTANEOUS

## 2022-12-08 MED ORDER — SERTRALINE HCL 25 MG PO TABS
75.0000 mg | ORAL_TABLET | Freq: Every day | ORAL | 6 refills | Status: DC
Start: 2022-12-08 — End: 2023-01-04
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
## 2022-12-08 NOTE — Progress Notes (Deleted)
Marland Kitchen

## 2022-12-23 ENCOUNTER — Encounter: Payer: Self-pay | Admitting: Licensed Practical Nurse

## 2022-12-30 ENCOUNTER — Other Ambulatory Visit: Payer: Self-pay | Admitting: Licensed Practical Nurse

## 2022-12-30 DIAGNOSIS — F3289 Other specified depressive episodes: Secondary | ICD-10-CM

## 2023-02-15 ENCOUNTER — Encounter: Payer: Self-pay | Admitting: Licensed Practical Nurse

## 2023-02-17 ENCOUNTER — Encounter: Payer: Self-pay | Admitting: Licensed Practical Nurse

## 2023-02-17 ENCOUNTER — Other Ambulatory Visit: Payer: Self-pay | Admitting: Licensed Practical Nurse

## 2023-02-17 DIAGNOSIS — F3289 Other specified depressive episodes: Secondary | ICD-10-CM

## 2023-02-17 MED ORDER — SERTRALINE HCL 100 MG PO TABS
100.0000 mg | ORAL_TABLET | Freq: Every day | ORAL | 3 refills | Status: DC
Start: 2023-02-17 — End: 2023-03-12

## 2023-02-17 NOTE — Progress Notes (Signed)
TC to [place] I am more snappy and more agitated  upset easily quick to anger.  Gets about 8 hours of sleep. At home with the baby, lives with her grandparents they are helpful. The FOB is around on the weekends, he works out of town during the week. Taydem walks twice a day 30 mins each time. Does not do mindfulness  Gets agitated 3 times a day. Sometimes I just shut down and want to be left alone and no one leaves me alone. The baby is not the cause of her stress. She currently breastfeeding, it is going well. The baby is growing and now teething.  Recommended finding ways to get time to herself, try mindfulness activities.  Perlie had a therapist, but now that she is on Medicaid, her therapist cannot see her. She is having a hard time finding a therapist that takes her insurance. Recommend seeing Marchelle Folks at the health department.  EPDS 10, was 0 on 7/23 Will increase Zoloft to 100mg  daily. Carie Caddy, CNM   [facility]  02/17/23  2:40 PM

## 2023-03-12 ENCOUNTER — Other Ambulatory Visit: Payer: Self-pay | Admitting: Licensed Practical Nurse

## 2023-03-12 DIAGNOSIS — F3289 Other specified depressive episodes: Secondary | ICD-10-CM
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
## 2023-08-23 ENCOUNTER — Ambulatory Visit (INDEPENDENT_AMBULATORY_CARE_PROVIDER_SITE_OTHER): Admitting: Licensed Practical Nurse

## 2023-08-23 VITALS — BP 114/71 | HR 87 | Ht 64.0 in | Wt 138.0 lb

## 2023-08-23 DIAGNOSIS — R5383 Other fatigue: Secondary | ICD-10-CM

## 2023-08-23 DIAGNOSIS — Z3046 Encounter for surveillance of implantable subdermal contraceptive: Secondary | ICD-10-CM | POA: Diagnosis not present

## 2023-08-23 DIAGNOSIS — F324 Major depressive disorder, single episode, in partial remission: Secondary | ICD-10-CM

## 2023-08-23 DIAGNOSIS — Z30011 Encounter for initial prescription of contraceptive pills: Secondary | ICD-10-CM

## 2023-08-23 MED ORDER — SERTRALINE HCL 25 MG PO TABS: 75.0000 mg | ORAL_TABLET | Freq: Every day | ORAL | 0 refills | Status: DC

## 2023-08-23 MED ORDER — FLUOXETINE HCL 20 MG PO CAPS: 20.0000 mg | ORAL_CAPSULE | Freq: Every day | ORAL | 3 refills | Status: DC

## 2023-08-23 MED ORDER — NORETHINDRONE ACET-ETHINYL EST 1-20 MG-MCG PO TABS
1.0000 | ORAL_TABLET | Freq: Every day | ORAL | 11 refills | Status: DC
Start: 2023-08-23 — End: 2024-01-25

## 2023-08-24 ENCOUNTER — Encounter: Payer: Self-pay | Admitting: Licensed Practical Nurse

## 2023-08-24 ENCOUNTER — Other Ambulatory Visit: Payer: Self-pay

## 2023-08-24 DIAGNOSIS — Z7689 Persons encountering health services in other specified circumstances: Secondary | ICD-10-CM

## 2023-08-24 LAB — TSH+FREE T4
Free T4: 1.07 ng/dL (ref 0.82–1.77)
TSH: 0.991 u[IU]/mL (ref 0.450–4.500)

## 2023-08-24 LAB — CBC
Hematocrit: 37.2 % (ref 34.0–46.6)
Hemoglobin: 11.9 g/dL (ref 11.1–15.9)
MCH: 27.3 pg (ref 26.6–33.0)
MCHC: 32 g/dL (ref 31.5–35.7)
MCV: 85 fL (ref 79–97)
Platelets: 302 10*3/uL (ref 150–450)
RBC: 4.36 x10E6/uL (ref 3.77–5.28)
RDW: 13.1 % (ref 11.7–15.4)
WBC: 6.1 10*3/uL (ref 3.4–10.8)

## 2023-08-24 MED ORDER — BOOST BREEZE PO LIQD
ORAL | 12 refills | Status: DC
Start: 2023-08-24 — End: 2024-01-25

## 2023-08-24 NOTE — Progress Notes (Unsigned)
 SUBJECTIVE: Here for Nexplanon removal, she has had a period for the last 3 months, she has been really tired. She would like to try OCP's she denies tobacco/nicotine use or hx Migraines with aura   Nena would also like to discuss stopping Zoloft. She reports she is not depressed, she is more irritable. She seems to have a "short fuse".   Jeri Lager would like a script for whole milk and boost to help her maintain her weight.    OBJECTIVE: BP 114/71 (BP Location: Left Arm, Patient Position: Sitting, Cuff Size: Normal)   Pulse 87   Ht 5\' 4"  (1.626 m)   Wt 138 lb (62.6 kg)   LMP 06/28/2023 (Approximate)   Breastfeeding Yes   BMI 23.69 kg/m   GEN: NAD  Lungs: breathing without difficulty   GYNECOLOGY PROCEDURE NOTE  Implanon removal discussed in detail.  Risks of infection, bleeding, nerve injury all reviewed.  Patient understands risks and desires to proceed.  Verbal consent obtained.  Patient is certain she wants the implanon removed.  All questions answered.  Procedure: Patient placed in dorsal supine with left arm above head, elbow flexed at 90 degrees, arm resting on examination table.  Implanon identified without problems.  Betadine scrub x3.  1 ml of 1% lidocaine injected under implanon device without problems.  Sterile gloves applied.  Small 0.5cm incision made at distal tip of implanon device with 11 blade scalpel.  Implanon brought to incision and grasped with a small kelly clamp.  Implanon removed intact without problems.  Pressure applied to incision.  Hemostasis obtained.  Steri-strips applied, followed by bandage and compression dressing.  Patient tolerated procedure well.  No complications.   Assessment: 23 y.o. year old female now s/p uncomplicated implanon removal. Need for contraception Depression  Plan: 1.  Patient given post procedure precautions and asked to call for fever, chills, redness or drainage from her incision, bleeding from incision.  She understands she  will likely have a small bruise near site of removal and can remove bandage tomorrow and steri-strips in approximately 1 week.  2. Will wean off of Zoloft by decreasing by 25mg  every 1-2 weeks then Start Prozac.  -RTC 2-3 months   3. Script for whole milk and boost given   4. Fatigue: CBC and TSH collected   Carie Caddy, [place]   [facility]  08/26/23  1:43 PM

## 2023-08-26 ENCOUNTER — Encounter: Payer: Self-pay | Admitting: Licensed Practical Nurse

## 2023-09-13 ENCOUNTER — Other Ambulatory Visit: Payer: Self-pay | Admitting: Licensed Practical Nurse

## 2023-09-15 ENCOUNTER — Other Ambulatory Visit: Payer: Self-pay | Admitting: Licensed Practical Nurse

## 2023-10-18 ENCOUNTER — Other Ambulatory Visit: Payer: Self-pay | Admitting: Advanced Practice Midwife

## 2023-10-18 ENCOUNTER — Other Ambulatory Visit: Payer: Self-pay | Admitting: Certified Nurse Midwife

## 2023-10-18 DIAGNOSIS — Z3403 Encounter for supervision of normal first pregnancy, third trimester: Secondary | ICD-10-CM

## 2023-10-18 DIAGNOSIS — F419 Anxiety disorder, unspecified: Secondary | ICD-10-CM

## 2023-10-22 ENCOUNTER — Ambulatory Visit: Admitting: Licensed Practical Nurse

## 2023-11-02 ENCOUNTER — Ambulatory Visit: Admitting: Licensed Practical Nurse

## 2023-11-02 VITALS — BP 112/94 | HR 103 | Ht 64.0 in | Wt 137.4 lb

## 2023-11-02 DIAGNOSIS — Z3042 Encounter for surveillance of injectable contraceptive: Secondary | ICD-10-CM | POA: Diagnosis not present

## 2023-11-02 DIAGNOSIS — Z3202 Encounter for pregnancy test, result negative: Secondary | ICD-10-CM

## 2023-11-02 DIAGNOSIS — R300 Dysuria: Secondary | ICD-10-CM | POA: Diagnosis not present

## 2023-11-02 DIAGNOSIS — Z79899 Other long term (current) drug therapy: Secondary | ICD-10-CM

## 2023-11-02 LAB — POCT URINALYSIS DIPSTICK
Bilirubin, UA: NEGATIVE
Blood, UA: POSITIVE
Glucose, UA: NEGATIVE — AB
Ketones, UA: NEGATIVE
Nitrite, UA: POSITIVE
Protein, UA: POSITIVE — AB
Spec Grav, UA: 1.015 (ref 1.010–1.025)
Urobilinogen, UA: 0.2 U/dL
pH, UA: 7.5 (ref 5.0–8.0)

## 2023-11-02 LAB — POCT URINE PREGNANCY: Preg Test, Ur: NEGATIVE

## 2023-11-02 MED ORDER — MEDROXYPROGESTERONE ACETATE 150 MG/ML IM SUSY
150.0000 mg | PREFILLED_SYRINGE | Freq: Once | INTRAMUSCULAR | Status: AC
Start: 2023-11-02 — End: 2023-11-02
  Administered 2023-11-02: 150 mg via INTRAMUSCULAR
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
## 2023-11-04 ENCOUNTER — Encounter: Payer: Self-pay | Admitting: Licensed Practical Nurse

## 2023-11-06 LAB — URINE CULTURE

## 2023-11-09 ENCOUNTER — Other Ambulatory Visit: Payer: Self-pay | Admitting: Licensed Practical Nurse

## 2023-11-09 DIAGNOSIS — N390 Urinary tract infection, site not specified: Secondary | ICD-10-CM

## 2023-11-09 MED ORDER — SULFAMETHOXAZOLE-TRIMETHOPRIM 800-160 MG PO TABS: 1.0000 | ORAL_TABLET | Freq: Two times a day (BID) | ORAL | 1 refills | Status: DC

## 2023-11-09 NOTE — Progress Notes (Signed)
 Pt seen for UTI sxs, culture confirms UTI. TC to pt, reviewed results Script for bactrim sent  Tasha Oneal HOWARD  [facility]  11/09/23  12:38 PM

## 2023-12-08 ENCOUNTER — Ambulatory Visit: Admitting: Family Medicine

## 2023-12-13 ENCOUNTER — Ambulatory Visit: Admitting: Licensed Practical Nurse

## 2024-01-24 NOTE — Patient Instructions (Signed)

## 2024-01-24 NOTE — Progress Notes (Signed)
    NURSE VISIT NOTE  Subjective:    Patient ID: YULA CROTWELL, female    DOB: 02/12/2001, 23 y.o.   MRN: [id number]  HPI  Patient is a 23 y.o. G64P1001 female who presents for depo provera  injection.   Objective:    There were no vitals taken for this visit.  Last Annual: unknown. Last pap: Not age appropriate. Last Depo-Provera : 11/02/2023. Side Effects if any: none. Serum HCG indicated? No . Depo-Provera  150 mg IM given by: Camelia Fetters, CMA. Site: {AOB INJ [id number]  Lab Review  No results found for any visits on 01/25/24.  Assessment:   1. Encounter for Depo-Provera  contraception      Plan:   Next appointment due between Nov. 24 and Dec. 8.    Camelia Fetters, CMA [facility]

## 2024-01-25 ENCOUNTER — Ambulatory Visit (INDEPENDENT_AMBULATORY_CARE_PROVIDER_SITE_OTHER)

## 2024-01-25 VITALS — BP 118/84 | HR 72 | Resp 16 | Ht 64.0 in | Wt 138.0 lb

## 2024-01-25 DIAGNOSIS — Z3009 Encounter for other general counseling and advice on contraception: Secondary | ICD-10-CM

## 2024-01-25 DIAGNOSIS — Z3042 Encounter for surveillance of injectable contraceptive: Secondary | ICD-10-CM

## 2024-01-25 MED ORDER — NORELGESTROMIN-ETH ESTRADIOL 150-35 MCG/24HR TD PTWK: 1.0000 | MEDICATED_PATCH | TRANSDERMAL | 12 refills | Status: AC
